# Patient Record
Sex: Male | Born: 1970 | Race: White | Hispanic: No | Marital: Married | Smoking: Former smoker
Health system: Southern US, Community
[De-identification: ages and names within clinical notes are randomized; demographics above are authoritative.]

## PROBLEM LIST (undated history)

## (undated) DIAGNOSIS — K649 Unspecified hemorrhoids: Secondary | ICD-10-CM

## (undated) DIAGNOSIS — A6000 Herpesviral infection of urogenital system, unspecified: Secondary | ICD-10-CM

## (undated) HISTORY — PX: ORIF PROXIMAL TIBIAL PLATEAU FRACTURE: SUR953

## (undated) HISTORY — PX: FRACTURE SURGERY: SHX138

## (undated) HISTORY — DX: Unspecified hemorrhoids: K64.9

## (undated) HISTORY — DX: Herpesviral infection of urogenital system, unspecified: A60.00

## (undated) HISTORY — PX: WISDOM TOOTH EXTRACTION: SHX21

---

## 2015-12-09 DIAGNOSIS — F4321 Adjustment disorder with depressed mood: Secondary | ICD-10-CM | POA: Diagnosis not present

## 2015-12-17 DIAGNOSIS — F4321 Adjustment disorder with depressed mood: Secondary | ICD-10-CM | POA: Diagnosis not present

## 2016-01-01 DIAGNOSIS — F4321 Adjustment disorder with depressed mood: Secondary | ICD-10-CM | POA: Diagnosis not present

## 2016-01-12 DIAGNOSIS — F4321 Adjustment disorder with depressed mood: Secondary | ICD-10-CM | POA: Diagnosis not present

## 2016-01-21 DIAGNOSIS — F4321 Adjustment disorder with depressed mood: Secondary | ICD-10-CM | POA: Diagnosis not present

## 2016-01-28 DIAGNOSIS — F4321 Adjustment disorder with depressed mood: Secondary | ICD-10-CM | POA: Diagnosis not present

## 2016-02-04 DIAGNOSIS — F4321 Adjustment disorder with depressed mood: Secondary | ICD-10-CM | POA: Diagnosis not present

## 2016-02-10 DIAGNOSIS — F4321 Adjustment disorder with depressed mood: Secondary | ICD-10-CM | POA: Diagnosis not present

## 2016-03-03 DIAGNOSIS — F4321 Adjustment disorder with depressed mood: Secondary | ICD-10-CM | POA: Diagnosis not present

## 2016-04-21 DIAGNOSIS — F4321 Adjustment disorder with depressed mood: Secondary | ICD-10-CM | POA: Diagnosis not present

## 2016-08-09 ENCOUNTER — Ambulatory Visit (INDEPENDENT_AMBULATORY_CARE_PROVIDER_SITE_OTHER): Payer: BLUE CROSS/BLUE SHIELD | Admitting: Family Medicine

## 2016-08-09 ENCOUNTER — Other Ambulatory Visit: Payer: Self-pay | Admitting: Family Medicine

## 2016-08-09 ENCOUNTER — Encounter: Payer: Self-pay | Admitting: Family Medicine

## 2016-08-09 VITALS — BP 127/80 | HR 74 | Temp 98.5°F | Resp 16 | Ht 73.0 in | Wt 225.8 lb

## 2016-08-09 DIAGNOSIS — A6002 Herpesviral infection of other male genital organs: Secondary | ICD-10-CM | POA: Diagnosis not present

## 2016-08-09 DIAGNOSIS — A6000 Herpesviral infection of urogenital system, unspecified: Secondary | ICD-10-CM | POA: Insufficient documentation

## 2016-08-09 DIAGNOSIS — Z0000 Encounter for general adult medical examination without abnormal findings: Secondary | ICD-10-CM

## 2016-08-09 DIAGNOSIS — M25521 Pain in right elbow: Secondary | ICD-10-CM | POA: Diagnosis not present

## 2016-08-09 DIAGNOSIS — Z7689 Persons encountering health services in other specified circumstances: Secondary | ICD-10-CM

## 2016-08-09 DIAGNOSIS — Z114 Encounter for screening for human immunodeficiency virus [HIV]: Secondary | ICD-10-CM

## 2016-08-09 DIAGNOSIS — Z131 Encounter for screening for diabetes mellitus: Secondary | ICD-10-CM

## 2016-08-09 DIAGNOSIS — D229 Melanocytic nevi, unspecified: Secondary | ICD-10-CM

## 2016-08-09 NOTE — Patient Instructions (Addendum)
Thank you for coming to the clinic today.  1. Referral to Dermatology - if you don't hear back within 2 weeks, call their office to check status  [place]   [place], [place] [address] Hours: 8AM-5PM Phone: [phone]  Sarina Ser, MD Brendolyn Patty, MD  --------------------------------- Vitals are good, No elevated blood pressure.  Try to work on improving regular exercise. Reduce Whole milk to 2% milk, and drink more water.  For the R elbow, likely a bursitis or swollen tissue around bursa of Elbow joint  Recommend to start taking Tylenol Extra Strength 500mg  tabs - take 1 to 2 tabs per dose (max 1000mg ) every 6-8 hours for pain (take regularly, don't skip a dose for next 7 days), max 24 hour daily dose is 6 tablets or 3000mg . In the future you can repeat the same everyday Tylenol course for 1-2 weeks at a time.  Try a muscle rub or topical medicine OTC.   If needed can take Ibuprofen/Advil/Naproxen as needed  Use RICE therapy: - R - Rest / relative rest with activity modification avoid overuse of joint - I - Ice packs (make sure you use a towel or sock / something to protect skin) - C - Compression with flexible Elbow Sleeve / ACE wrap to apply pressure and reduce swelling allowing more support - E - Elevation - if significant swelling, lift leg above heart level (toes above your nose) to help reduce swelling, most helpful at night after day of being on your feet   You will be due for FASTING BLOOD WORK (no food or drink after midnight before, only water or coffee without cream/sugar on the morning of)  - Please go ahead and schedule a "Lab Only" visit in the morning at the clinic for lab draw in 3 months  For Lab Results, once available within 2-3 days of blood draw, you can can log in to MyChart online to view your results and a brief explanation. Also, we can discuss results at next follow-up visit.  Please  schedule a Follow-up Appointment to: Return in about 3 months (around 11/09/2016) for Annual Physical.  If you have any other questions or concerns, please feel free to call the clinic or send a message through [facility]. You may also schedule an earlier appointment if necessary.  Kenneth Putnam, DO [facility]

## 2016-08-09 NOTE — Progress Notes (Signed)
Subjective:    Patient ID: Kenneth Young, male    DOB: 06/08/1970, 46 y.o.   MRN: [id number]  Kenneth Young is a 46 y.o. male presenting on 08/09/2016 for Establish Care (pt is concerned about moles needs referral for dermatology)  Previously from [place], he has been in [place] off and on for 8 years, and has been [place] resident for 4 years. Prior PCP in [place] several years ago.  HPI   Establish Care Today  Elevated BP in past remotely / No history of HTN - Reports no known concern with HTN in past. Has had occasional rare elevated BP. Does not check BP at home - Diet: Eats mostly balanced eats 50% at home and out. No significant concerns. Drinks mostly whole milk, coffee, some water daily. Uses sugar free flavoring for water. - Exercise: None currently active. 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 In past has used Famacyclovir PRN flare, this has run out. Not requesting refill today. No recent flare  Abnormal Mole R Toe / Generalized Moles on Back / History Excessive Sun Exposure - Reports concern today with abnormal raised pigmented mole on inner side of his Right great toe, has been there for >8 years, and previously dermatology was following him in [place], was told to "keep a watch on this one" and he is concerned it may have changed over the years. Asking second opinion. Also he endorses several moles on his back, has had other biopsied before unsure exact results. No known skin cancer or family history of skin cancer that he is aware of, but admits excessive sun exposure as child and sun burns.  R Elbow Pain / Fall: - Additional complaint with reported R elbow injury about 1-2 months ago, fall on stairs while going down, accidental tripped, and he had initial pain and swelling some bruising, then it  improved over few days to weeks with conservative therapy. No significant treatments done or evaluation. No X-ray. He felt maybe "a small spot was loose" in his elbow but could not tell seemed to resolve.  Past Medical History:  Diagnosis Date  . Genital herpes   . Hemorrhoid    Past Surgical History:  Procedure Laterality Date  . ORIF PROXIMAL TIBIAL PLATEAU FRACTURE    . WISDOM TOOTH EXTRACTION     Social History   Social History  . Marital status: Married    Spouse name: N/A  . Number of children: 1  . Years of education: College   Occupational History  . [employer], Has degree in Classical Greek/Latin   Social History Main Topics  . Smoking status: Former Smoker    Packs/day: 1.00    Years: 10.00    Types: Cigarettes, E-cigarettes    Quit date: 03/01/1992  . Smokeless tobacco: Former Systems developer     Comment: Former history smoked for 10 years, then quit  . Alcohol use 1.2 oz/week    2 Cans of beer per week     Comment: Limited to beer only  . Drug use: Yes    Types: Marijuana     Comment: 3x weekly, recreational / relaxation  . Sexual activity: Not on file   Other Topics Concern  . Not on file   Social History Narrative  . No narrative  on file   Family History  Problem Relation Age of Onset  . Cataracts Mother   . Osteoarthritis Mother        Bilateral Hip Replacement Surgery  . Hypertension Brother   . Prostate cancer Neg Hx   . Colon cancer Neg Hx    No current outpatient prescriptions on file prior to visit.   No current facility-administered medications on file prior to visit.     Review of Systems  Constitutional: Negative for activity change, appetite change, chills, diaphoresis, fatigue, fever and unexpected weight change.  HENT: Negative for congestion, hearing loss and sinus pressure.   Eyes: Negative for visual disturbance.  Respiratory: Negative for cough, chest tightness, shortness of breath and wheezing.     Cardiovascular: Negative for chest pain, palpitations and leg swelling.  Gastrointestinal: Negative for abdominal pain, anal bleeding, blood in stool, constipation, diarrhea, nausea and vomiting.  Endocrine: Negative for cold intolerance.  Genitourinary: Negative for decreased urine volume, difficulty urinating, dysuria, frequency, hematuria and testicular pain.  Musculoskeletal: Positive for arthralgias (Right elbow). Negative for back pain and neck pain.  Skin: Negative for rash.  Allergic/Immunologic: Negative for environmental allergies.  Neurological: Negative for dizziness, weakness, light-headedness, numbness and headaches.  Hematological: Negative for adenopathy.  Psychiatric/Behavioral: Negative for behavioral problems, dysphoric mood and sleep disturbance. The patient is not nervous/anxious.    Per HPI unless specifically indicated above     Objective:    BP 127/80   Pulse 74   Temp 98.5 F (36.9 C) (Oral)   Resp 16   Ht 6\' 1"  (1.854 m)   Wt 225 lb 12.8 oz (102.4 kg)   BMI 29.79 kg/m   Wt Readings from Last 3 Encounters:  08/09/16 225 lb 12.8 oz (102.4 kg)    Physical Exam  Constitutional: He is oriented to person, place, and time. He appears well-developed and well-nourished. No distress.  Well-appearing, comfortable, cooperative  HENT:  Head: Normocephalic and atraumatic.  Mouth/Throat: Oropharynx is clear and moist.  Eyes: Conjunctivae are normal. Right eye exhibits no discharge. Left eye exhibits no discharge.  Neck: Normal range of motion. Neck supple.  Cardiovascular: Normal rate.   Pulmonary/Chest: Effort normal.  Musculoskeletal: Normal range of motion. He exhibits no edema.  Elbow Inspection: slightly more full appearance R elbow, without obvious edema Palpation: mild tender deeper compression, otherwise mostly non-tender, benign. Slight palpation of thickened bursal sac with some minimal fluid ROM: full active ROM flex/ext Strength: 5/5  intact Neurovascular: intact  Neurological: He is alert and oriented to person, place, and time.  Skin: Skin is warm and dry. No rash noted. He is not diaphoretic. No erythema.  Multiple abnormal skin lesions  Lateral aspect of R great toe about < 1 cm hyperpigmented papular lesion, not entirely consistent with nevi, may be wart.  Back with extensive scattered benign appearing nevi, only few with slight asymmetry, and one spot non-pigmented lesion that has bothered him in past.  Psychiatric: He has a normal mood and affect. His behavior is normal.  Well groomed, good eye contact, normal speech and thoughts  Nursing note and vitals reviewed.    Right Foot / R Great toe   Back     No results found for this or any previous visit.    Assessment & Plan:   Problem List Items Addressed This Visit    Multiple atypical nevi - Primary    No acute changes today concerning for current malignancy or melanoma, but extensive amount of variable  nevi, did not perform complete body surface exam today. Patient prefers establish with local Dermatology, had been followed for years in [place]. - Referral to [facility] for evaluation of chronic skin lesion on R great toe and for routine skin CA screening body surface exam      Relevant Orders   Ambulatory referral to Dermatology   Genital herpes    Stable without complication or recent flare Secondary to exposure with current wife Off of medication PRN, had used Oral Acyclovir type med in past, declined refill       Other Visit Diagnoses    Encounter to establish care with new doctor       Right elbow pain      Most likely secondary to traumatic fall recently R elbow likely contusion with ecchymosis, and possibly some lingering bursitis with slight swelling and intermittent discomfort but overall gradual improvement unlikely to be fracture or other significant MSK injury - Reviewed conservative therapy today for Tylenol/NSAID PRN, Topical  relief, RICE therapy - Follow-up PRN - defer X-ray for now    History of Elevated BP - Normal BP today, monitor in office - Monitor outside office if able to    No orders of the defined types were placed in this encounter.     Follow up plan: Return in about 3 months (around 11/09/2016) for Annual Physical.  Future labs ordered.  Nobie Putnam, [facility] 08/09/2016, 1:22 PM

## 2016-08-09 NOTE — Assessment & Plan Note (Signed)
No acute changes today concerning for current malignancy or melanoma, but extensive amount of variable nevi, did not perform complete body surface exam today. Patient prefers establish with local Dermatology, had been followed for years in [place]. - Referral to [facility] for evaluation of chronic skin lesion on R great toe and for routine skin CA screening body surface exam

## 2016-08-09 NOTE — Assessment & Plan Note (Signed)
Stable without complication or recent flare Secondary to exposure with current wife Off of medication PRN, had used Oral Acyclovir type med in past, declined refill

## 2016-11-24 DIAGNOSIS — L820 Inflamed seborrheic keratosis: Secondary | ICD-10-CM | POA: Diagnosis not present

## 2016-11-24 DIAGNOSIS — D2271 Melanocytic nevi of right lower limb, including hip: Secondary | ICD-10-CM | POA: Diagnosis not present

## 2016-11-24 DIAGNOSIS — R21 Rash and other nonspecific skin eruption: Secondary | ICD-10-CM | POA: Diagnosis not present

## 2016-11-24 DIAGNOSIS — D225 Melanocytic nevi of trunk: Secondary | ICD-10-CM | POA: Diagnosis not present

## 2016-11-24 DIAGNOSIS — L578 Other skin changes due to chronic exposure to nonionizing radiation: Secondary | ICD-10-CM | POA: Diagnosis not present

## 2016-11-24 DIAGNOSIS — D485 Neoplasm of uncertain behavior of skin: Secondary | ICD-10-CM | POA: Diagnosis not present

## 2016-11-24 DIAGNOSIS — D1800 Hemangioma unspecified site: Secondary | ICD-10-CM | POA: Diagnosis not present

## 2016-11-24 DIAGNOSIS — D229 Melanocytic nevi, unspecified: Secondary | ICD-10-CM | POA: Diagnosis not present

## 2017-03-10 DIAGNOSIS — D2371 Other benign neoplasm of skin of right lower limb, including hip: Secondary | ICD-10-CM | POA: Diagnosis not present

## 2017-04-11 DIAGNOSIS — D229 Melanocytic nevi, unspecified: Secondary | ICD-10-CM | POA: Diagnosis not present

## 2017-04-11 DIAGNOSIS — D2271 Melanocytic nevi of right lower limb, including hip: Secondary | ICD-10-CM | POA: Diagnosis not present

## 2017-04-12 ENCOUNTER — Ambulatory Visit: Payer: Self-pay

## 2017-04-12 ENCOUNTER — Encounter: Payer: Self-pay | Admitting: Podiatry

## 2017-04-12 ENCOUNTER — Ambulatory Visit (INDEPENDENT_AMBULATORY_CARE_PROVIDER_SITE_OTHER): Payer: BLUE CROSS/BLUE SHIELD

## 2017-04-12 ENCOUNTER — Ambulatory Visit (INDEPENDENT_AMBULATORY_CARE_PROVIDER_SITE_OTHER): Payer: BLUE CROSS/BLUE SHIELD | Admitting: Podiatry

## 2017-04-12 VITALS — BP 142/88 | HR 63 | Resp 16

## 2017-04-12 DIAGNOSIS — M722 Plantar fascial fibromatosis: Secondary | ICD-10-CM | POA: Diagnosis not present

## 2017-04-12 MED ORDER — DICLOFENAC SODIUM 75 MG PO TBEC: 75.0000 mg | DELAYED_RELEASE_TABLET | Freq: Two times a day (BID) | ORAL | 1 refills | Status: DC

## 2017-04-12 NOTE — Progress Notes (Signed)
   Subjective:    Patient ID: Kenneth Young, male    DOB: 12-31-1970, 47 y.o.   MRN: [id number]  HPI Chief Complaint  Patient presents with  . Foot Pain    Plantar heel left - aching x 1 month, onset was sudden, no injury, AM pain, tried advil-helps some      Review of Systems  All other systems reviewed and are negative.      Objective:   Physical Exam        Assessment & Plan:

## 2017-04-12 NOTE — Patient Instructions (Signed)

## 2017-04-13 NOTE — Progress Notes (Signed)
   Subjective: 47 year old male presents today with a chief complaint of aching pain and tenderness in the plantar aspect of the left heel that began suddenly one month ago. He states that it hurts in the mornings with the first steps out of bed. He has been taking Advil with some relief of the pain. Patient presents today for further treatment and evaluation.  Past Medical History:  Diagnosis Date  . Genital herpes   . Hemorrhoid     Objective: Physical Exam General: The patient is alert and oriented x3 in no acute distress.  Dermatology: Skin is warm, dry and supple bilateral lower extremities. Negative for open lesions or macerations bilateral.   Vascular: Dorsalis Pedis and Posterior Tibial pulses palpable bilateral.  Capillary fill time is immediate to all digits.  Neurological: Epicritic and protective threshold intact bilateral.   Musculoskeletal: Tenderness to palpation at the medial calcaneal tubercale and through the insertion of the plantar fascia of the left foot. All other joints range of motion within normal limits bilateral. Strength 5/5 in all groups bilateral.   Radiographic exam:   Normal osseous mineralization. Joint spaces preserved. No fracture/dislocation/boney destruction. Calcaneal spur present with mild thickening of plantar fascia left. No other soft tissue abnormalities or radiopaque foreign bodies.   Assessment: 1. Plantar fasciitis left foot  Plan of Care:  1. Patient evaluated. Xrays reviewed.   2. Injection of 0.5cc Celestone soluspan injected into the left plantar fascia.  3. Rx for Diclofenac 75mg  PO BID ordered for patient. 4. Plantar fascial band(s) dispensed  5. Instructed patient regarding therapies and modalities at home to alleviate symptoms.  6. Return to clinic in 4 weeks.    Works in Engineer, technical sales, [employer]   Edrick Kins, DPM [facility]  Dr. Edrick Kins, DPM    [address], [place] [address]                Office [phone]  Fax [phone]

## 2017-05-10 ENCOUNTER — Ambulatory Visit: Payer: BLUE CROSS/BLUE SHIELD | Admitting: Podiatry

## 2017-10-10 DIAGNOSIS — L720 Epidermal cyst: Secondary | ICD-10-CM | POA: Diagnosis not present

## 2017-10-10 DIAGNOSIS — L820 Inflamed seborrheic keratosis: Secondary | ICD-10-CM | POA: Diagnosis not present

## 2017-10-10 DIAGNOSIS — L739 Follicular disorder, unspecified: Secondary | ICD-10-CM | POA: Diagnosis not present

## 2017-10-10 DIAGNOSIS — D485 Neoplasm of uncertain behavior of skin: Secondary | ICD-10-CM | POA: Diagnosis not present

## 2019-02-16 ENCOUNTER — Telehealth: Payer: Self-pay | Admitting: Family Medicine

## 2019-02-16 DIAGNOSIS — Z0000 Encounter for general adult medical examination without abnormal findings: Secondary | ICD-10-CM

## 2019-02-19 NOTE — Telephone Encounter (Signed)
Labs ordered.  Nobie Putnam, DO [facility] 02/19/2019, 4:37 PM

## 2019-03-05 ENCOUNTER — Other Ambulatory Visit: Payer: Self-pay

## 2019-03-05 DIAGNOSIS — Z0000 Encounter for general adult medical examination without abnormal findings: Secondary | ICD-10-CM

## 2019-03-06 LAB — CBC WITH DIFFERENTIAL/PLATELET
Absolute Monocytes: 753 cells/uL (ref 200–950)
Basophils Absolute: 57 cells/uL (ref 0–200)
Basophils Relative: 0.8 %
Eosinophils Absolute: 192 cells/uL (ref 15–500)
Eosinophils Relative: 2.7 %
HCT: 43.6 % (ref 38.5–50.0)
Hemoglobin: 14.7 g/dL (ref 13.2–17.1)
Lymphs Abs: 1917 cells/uL (ref 850–3900)
MCH: 29 pg (ref 27.0–33.0)
MCHC: 33.7 g/dL (ref 32.0–36.0)
MCV: 86 fL (ref 80.0–100.0)
MPV: 11.9 fL (ref 7.5–12.5)
Monocytes Relative: 10.6 %
Neutro Abs: 4182 cells/uL (ref 1500–7800)
Neutrophils Relative %: 58.9 %
Platelets: 150 10*3/uL (ref 140–400)
RBC: 5.07 10*6/uL (ref 4.20–5.80)
RDW: 13 % (ref 11.0–15.0)
Total Lymphocyte: 27 %
WBC: 7.1 10*3/uL (ref 3.8–10.8)

## 2019-03-06 LAB — COMPLETE METABOLIC PANEL WITH GFR
AG Ratio: 1.6 (calc) (ref 1.0–2.5)
ALT: 51 U/L — ABNORMAL HIGH (ref 9–46)
AST: 29 U/L (ref 10–40)
Albumin: 4.6 g/dL (ref 3.6–5.1)
Alkaline phosphatase (APISO): 60 U/L (ref 36–130)
BUN: 14 mg/dL (ref 7–25)
CO2: 22 mmol/L (ref 20–32)
Calcium: 9.4 mg/dL (ref 8.6–10.3)
Chloride: 107 mmol/L (ref 98–110)
Creat: 1.04 mg/dL (ref 0.60–1.35)
GFR, Est African American: 97 mL/min/{1.73_m2} (ref >=60)
GFR, Est Non African American: 84 mL/min/{1.73_m2} (ref >=60)
Globulin: 2.8 g/dL (calc) (ref 1.9–3.7)
Glucose, Bld: 106 mg/dL — ABNORMAL HIGH (ref 65–99)
Potassium: 4.2 mmol/L (ref 3.5–5.3)
Sodium: 138 mmol/L (ref 135–146)
Total Bilirubin: 0.4 mg/dL (ref 0.2–1.2)
Total Protein: 7.4 g/dL (ref 6.1–8.1)

## 2019-03-06 LAB — LIPID PANEL
Cholesterol: 197 mg/dL (ref <200)
HDL: 32 mg/dL — ABNORMAL LOW (ref >=40)
LDL Cholesterol (Calc): 112 mg/dL (calc) — ABNORMAL HIGH
Non-HDL Cholesterol (Calc): 165 mg/dL (calc) — ABNORMAL HIGH (ref <130)
Total CHOL/HDL Ratio: 6.2 (calc) — ABNORMAL HIGH (ref <5.0)
Triglycerides: 369 mg/dL — ABNORMAL HIGH (ref <150)

## 2019-03-06 LAB — HEMOGLOBIN A1C
Hgb A1c MFr Bld: 5.7 % of total Hgb — ABNORMAL HIGH (ref <5.7)
Mean Plasma Glucose: 117 (calc)
eAG (mmol/L): 6.5 (calc)

## 2019-03-08 ENCOUNTER — Encounter: Payer: Self-pay | Admitting: Family Medicine

## 2019-03-08 ENCOUNTER — Other Ambulatory Visit: Payer: Self-pay

## 2019-03-08 ENCOUNTER — Other Ambulatory Visit: Payer: Self-pay | Admitting: Family Medicine

## 2019-03-08 ENCOUNTER — Ambulatory Visit (INDEPENDENT_AMBULATORY_CARE_PROVIDER_SITE_OTHER): Payer: BC Managed Care – PPO | Admitting: Family Medicine

## 2019-03-08 VITALS — BP 123/72 | HR 67 | Temp 98.0°F | Resp 16 | Ht 73.0 in | Wt 236.0 lb

## 2019-03-08 DIAGNOSIS — Z0000 Encounter for general adult medical examination without abnormal findings: Secondary | ICD-10-CM

## 2019-03-08 DIAGNOSIS — R7309 Other abnormal glucose: Secondary | ICD-10-CM

## 2019-03-08 DIAGNOSIS — K644 Residual hemorrhoidal skin tags: Secondary | ICD-10-CM | POA: Diagnosis not present

## 2019-03-08 DIAGNOSIS — Z125 Encounter for screening for malignant neoplasm of prostate: Secondary | ICD-10-CM

## 2019-03-08 DIAGNOSIS — Z23 Encounter for immunization: Secondary | ICD-10-CM

## 2019-03-08 DIAGNOSIS — E782 Mixed hyperlipidemia: Secondary | ICD-10-CM

## 2019-03-08 DIAGNOSIS — Z1211 Encounter for screening for malignant neoplasm of colon: Secondary | ICD-10-CM

## 2019-03-08 DIAGNOSIS — E669 Obesity, unspecified: Secondary | ICD-10-CM

## 2019-03-08 NOTE — Progress Notes (Signed)
Subjective:    Patient ID: Kenneth Young, male    DOB: [birth year], 49 y.o.   MRN: [id number]  Kenneth Young is a 49 y.o. male presenting on 03/08/2019 for Annual Exam   HPI   Here for Annual Physical and Lab Review.  HYPERLIPIDEMIA: - Last lipid panel 03/2019, low HDL and elevated LDL 112 and elevated TG >300 Not taking any cholesterol med  Pre-Diabetes / Obesity BMI >31 Reports no concerns previously, elevated A1c 5.7 on last result. Meds: never on med Weight gain in 2 years Lifestyle: - Diet (not adhering any particular diet, goal to improve)  Denies hypoglycemia, polyuria, visual changes, numbness or tingling.  External Hemorrhoids Admits some rectal bleeding, bright red blood per, occasional episode, some pain but no other complication. Has not been diagnosed or treated. He improves diet to help his bowel movements.  Taking Vitamin D immune benefit.   Health Maintenance:  Due for Flu Shot, will receive today    Prostate CA Screening: No prior prostate CA screening. Currently asymptomatic. No known family history of prostate CA. Due for screening.  Colon CA Screening: Never had colonoscopy. Currently asymptomatic except external hemorrhoid see above. No known family history of colon CA. Due for screening test considering Cologuard, counseling given   Depression screen [facility] 2/9 03/08/2019 08/09/2016  Decreased Interest 0 0  Down, Depressed, Hopeless 0 0  PHQ - 2 Score 0 0  Altered sleeping 0 -  Tired, decreased energy 0 -  Change in appetite 0 -  Feeling bad or failure about yourself  0 -  Trouble concentrating 0 -  Moving slowly or fidgety/restless 0 -  Suicidal thoughts 0 -  PHQ-9 Score 0 -  Difficult doing work/chores Not difficult at all -    Past Medical History:  Diagnosis Date  . Genital herpes   . Hemorrhoid    Past Surgical History:  Procedure Laterality Date  . ORIF PROXIMAL TIBIAL PLATEAU FRACTURE    . WISDOM TOOTH EXTRACTION     Social History    Socioeconomic History  . Marital status: Married    Spouse name: Not on file  . Number of children: 1  . Years of education: College  . Highest education level: Not on file  Occupational History  . Occupation: Aeronautical engineer    Comment: Computers, Has degree in Classical Greek/Latin  Tobacco Use  . Smoking status: Former Smoker    Packs/day: 1.00    Years: 10.00    Pack years: 10.00    Quit date: 03/01/1992    Years since quitting: 27.0  . Smokeless tobacco: Former Systems developer  . Tobacco comment: Former history smoked for 10 years, then quit  Substance and Sexual Activity  . Alcohol use: Yes    Alcohol/week: 2.0 standard drinks    Types: 2 Cans of beer per week    Comment: Limited to beer only  . Drug use: Yes    Types: Marijuana    Comment: 3x weekly, recreational / relaxation  . Sexual activity: Not on file  Other Topics Concern  . Not on file  Social History Narrative  . Not on file   Social Determinants of Health   Financial Resource Strain:   . Difficulty of Paying Living Expenses: Not on file  Food Insecurity:   . Worried About Charity fundraiser in the Last Year: Not on file  . Ran Out of Food in the Last Year: Not on file  Transportation Needs:   . Lack  of Transportation (Medical): Not on file  . Lack of Transportation (Non-Medical): Not on file  Physical Activity:   . Days of Exercise per Week: Not on file  . Minutes of Exercise per Session: Not on file  Stress:   . Feeling of Stress : Not on file  Social Connections:   . Frequency of Communication with Friends and Family: Not on file  . Frequency of Social Gatherings with Friends and Family: Not on file  . Attends Religious Services: Not on file  . Active Member of Clubs or Organizations: Not on file  . Attends Archivist Meetings: Not on file  . Marital Status: Not on file  Intimate Partner Violence:   . Fear of Current or Ex-Partner: Not on file  . Emotionally Abused: Not on file  .  Physically Abused: Not on file  . Sexually Abused: Not on file   Family History  Problem Relation Age of Onset  . Cataracts Mother   . Osteoarthritis Mother        Bilateral Hip Replacement Surgery  . Hypertension Brother   . Prostate cancer Neg Hx   . Colon cancer Neg Hx    Current Outpatient Medications on File Prior to Visit  Medication Sig  . diclofenac (VOLTAREN) 75 MG EC tablet Take 1 tablet (75 mg total) by mouth 2 (two) times daily. (Patient not taking: Reported on 03/08/2019)   No current facility-administered medications on file prior to visit.    Review of Systems  Constitutional: Negative for activity change, appetite change, chills, diaphoresis, fatigue and fever.  HENT: Negative for congestion and hearing loss.   Eyes: Negative for visual disturbance.  Respiratory: Negative for apnea, cough, chest tightness, shortness of breath and wheezing.   Cardiovascular: Negative for chest pain, palpitations and leg swelling.  Gastrointestinal: Positive for anal bleeding (hemorrhoid). Negative for abdominal pain, blood in stool, constipation, diarrhea, nausea and vomiting.  Endocrine: Negative for cold intolerance.  Genitourinary: Negative for decreased urine volume, difficulty urinating, dysuria, frequency, hematuria, testicular pain and urgency.  Musculoskeletal: Negative for arthralgias, back pain and neck pain.  Skin: Negative for rash.  Allergic/Immunologic: Negative for environmental allergies.  Neurological: Negative for dizziness, weakness, light-headedness, numbness and headaches.  Hematological: Negative for adenopathy.  Psychiatric/Behavioral: Negative for behavioral problems, dysphoric mood and sleep disturbance. The patient is not nervous/anxious.    Per HPI unless specifically indicated above      Objective:    BP 123/72   Pulse 67   Temp 98 F (36.7 C) (Oral)   Resp 16   Ht 6\' 1"  (1.854 m)   Wt 236 lb (107 kg)   BMI 31.14 kg/m   Wt Readings from Last 3  Encounters:  03/08/19 236 lb (107 kg)  08/09/16 225 lb 12.8 oz (102.4 kg)    Physical Exam Vitals and nursing note reviewed.  Constitutional:      General: He is not in acute distress.    Appearance: He is well-developed. He is not diaphoretic.     Comments: Well-appearing, comfortable, cooperative  HENT:     Head: Normocephalic and atraumatic.     Right Ear: Tympanic membrane, ear canal and external ear normal.     Left Ear: Tympanic membrane, ear canal and external ear normal.  Eyes:     General:        Right eye: No discharge.        Left eye: No discharge.     Conjunctiva/sclera: Conjunctivae normal.  Pupils: Pupils are equal, round, and reactive to light.  Neck:     Thyroid: No thyromegaly.     Comments: No carotid bruit Cardiovascular:     Rate and Rhythm: Normal rate and regular rhythm.     Heart sounds: Normal heart sounds. No murmur.  Pulmonary:     Effort: Pulmonary effort is normal. No respiratory distress.     Breath sounds: Normal breath sounds. No wheezing or rales.  Abdominal:     General: Bowel sounds are normal. There is no distension.     Palpations: Abdomen is soft. There is no mass.     Tenderness: There is no abdominal tenderness.  Genitourinary:    Comments: Rectal External rectal exam with moderate sized soft non inflammed external hemorrhoid R side approx 5 o clock. No fissure. Did not perform invasive DRE exam today. Musculoskeletal:        General: No tenderness. Normal range of motion.     Cervical back: Normal range of motion and neck supple.     Comments: Upper / Lower Extremities: - Normal muscle tone, strength bilateral upper extremities 5/5, lower extremities 5/5  Lymphadenopathy:     Cervical: No cervical adenopathy.  Skin:    General: Skin is warm and dry.     Findings: No erythema or rash.  Neurological:     Mental Status: He is alert and oriented to person, place, and time.     Comments: Distal sensation intact to light touch all  extremities  Psychiatric:        Behavior: Behavior normal.     Comments: Well groomed, good eye contact, normal speech and thoughts       Results for orders placed or performed in visit on 03/05/19  physical Lipid panel  Result Value Ref Range   Cholesterol 197 <200 mg/dL   HDL 32 (L) > OR = 40 mg/dL   Triglycerides 369 (H) <150 mg/dL   LDL Cholesterol (Calc) 112 (H) mg/dL (calc)   Total CHOL/HDL Ratio 6.2 (H) <5.0 (calc)   Non-HDL Cholesterol (Calc) 165 (H) <130 mg/dL (calc)  cmet with GFR physical  Result Value Ref Range   Glucose, Bld 106 (H) 65 - 99 mg/dL   BUN 14 7 - 25 mg/dL   Creat 1.04 0.60 - 1.35 mg/dL   GFR, Est Non African American 84 > OR = 60 mL/min/1.67m2   GFR, Est African American 97 > OR = 60 mL/min/1.4m2   BUN/Creatinine Ratio NOT APPLICABLE 6 - 22 (calc)   Sodium 138 135 - 146 mmol/L   Potassium 4.2 3.5 - 5.3 mmol/L   Chloride 107 98 - 110 mmol/L   CO2 22 20 - 32 mmol/L   Calcium 9.4 8.6 - 10.3 mg/dL   Total Protein 7.4 6.1 - 8.1 g/dL   Albumin 4.6 3.6 - 5.1 g/dL   Globulin 2.8 1.9 - 3.7 g/dL (calc)   AG Ratio 1.6 1.0 - 2.5 (calc)   Total Bilirubin 0.4 0.2 - 1.2 mg/dL   Alkaline phosphatase (APISO) 60 36 - 130 U/L   AST 29 10 - 40 U/L   ALT 51 (H) 9 - 46 U/L  physical HgB A1c  Result Value Ref Range   Hgb A1c MFr Bld 5.7 (H) <5.7 % of total Hgb   Mean Plasma Glucose 117 (calc)   eAG (mmol/L) 6.5 (calc)  physical CBC  Result Value Ref Range   WBC 7.1 3.8 - 10.8 Thousand/uL   RBC 5.07 4.20 - 5.80  Million/uL   Hemoglobin 14.7 13.2 - 17.1 g/dL   HCT 43.6 38.5 - 50.0 %   MCV 86.0 80.0 - 100.0 fL   MCH 29.0 27.0 - 33.0 pg   MCHC 33.7 32.0 - 36.0 g/dL   RDW 13.0 11.0 - 15.0 %   Platelets 150 140 - 400 Thousand/uL   MPV 11.9 7.5 - 12.5 fL   Neutro Abs 4,182 1,500 - 7,800 cells/uL   Lymphs Abs 1,917 850 - 3,900 cells/uL   Absolute Monocytes 753 200 - 950 cells/uL   Eosinophils Absolute 192 15 - 500 cells/uL   Basophils Absolute 57 0 - 200  cells/uL   Neutrophils Relative % 58.9 %   Total Lymphocyte 27.0 %   Monocytes Relative 10.6 %   Eosinophils Relative 2.7 %   Basophils Relative 0.8 %      Assessment & Plan:   Problem List Items Addressed This Visit    Mixed hyperlipidemia   External hemorrhoid   Elevated hemoglobin A1c    Other Visit Diagnoses    Annual physical exam    -  Primary   Needs flu shot       Relevant Orders   [facility] - Flu Vaccine QUAD 36+ mos PF IM (Fluarix & Fluzone Quad PF) (Completed)   Obesity (BMI 30.0-34.9)       Screening for colon cancer       Relevant Orders   [place] - Cologuard      Updated Health Maintenance information Reviewed recent lab results with patient Encouraged improvement to lifestyle with diet and exercise  Goal of weight loss  #Elevated lipids, dyslipidemia Discussed lifestyle Low risk ASCVD No new medication or treatment  #Elevated A1c 5.7, new problem Encourage lifestyle diet exercise, handout given low carb glycemic diet No medication at this time Repeat 6 months  #External hemorrhoid Uncomplicated Reviewed course with hemorrhoids and some treatment options Surveillance for now, diet improve  Due for routine colon cancer screening. Never had colonoscopy (not interested), no family history colon cancer. - Discussion today about recommendations for either Colonoscopy or Cologuard screening, benefits and risks of screening, interested in Cologuard, understands that if positive then recommendation is for diagnostic colonoscopy to follow-up. - Ordered Cologuard today  - Patient advised to contact insurance first to learn cost,    No orders of the defined types were placed in this encounter.   Follow up plan: Return in about 6 months (around 09/05/2019) for 6 month Elevated A1c / Lab test.   Future labs 6 months A1c and PSA age [place], [facility] 03/08/2019, 10:14 AM

## 2019-03-08 NOTE — Patient Instructions (Addendum)
Thank you for coming to the office today.  Recent Labs    03/05/19 0859  HGBA1C 5.7*   Goal to limit starch and sugar / carbs - to improve blood sugar Keep improving diet and exercise, weight loss  External hemorrhoid, uncomplicated - no sign of other problem with it at the moment.  Colon Cancer Screening: - For all adults age 50+ routine colon cancer screening is highly recommended.     - Recent guidelines from [place] recommend starting age of 19 - Early detection of colon cancer is important, because often there are no warning signs or symptoms, also if found early usually it can be cured. Late stage is hard to treat.  - If you are not interested in Colonoscopy screening (if done and normal you could be cleared for 5 to 10 years until next due), then Cologuard is an excellent alternative for screening test for Colon Cancer. It is highly sensitive for detecting DNA of colon cancer from even the earliest stages. Also, there is NO bowel prep required. - If Cologuard is NEGATIVE, then it is good for 3 years before next due - If Cologuard is POSITIVE, then it is strongly advised to get a Colonoscopy, which allows the GI doctor to locate the source of the cancer or polyp (even very early stage) and treat it by removing it. ------------------------- If you would like to proceed with Cologuard (stool DNA test) - FIRST, call your insurance company and tell them you want to check cost of Cologuard tell them CPT Code [phone] (it may be completely covered and you could get for no cost, OR max cost without any coverage is about $600). Also, keep in mind if you do NOT open the kit, and decide not to do the test, you will NOT be charged, you should contact the company if you decide not to do the test.  Follow instructions to collect sample, you may call the company for any help or questions, 24/7 telephone support at [phone].  DUE for NON fasting blood work(no food or drink after  midnight before the lab appointment, only water or coffee without cream/sugar on the morning of)  SCHEDULE "Lab Only" visit in the morning at the clinic for lab draw in 6 MONTHS   - Make sure Lab Only appointment is at about 1 week before your next appointment, so that results will be available  For Lab Results, once available within 2-3 days of blood draw, you can can log in to MyChart online to view your results and a brief explanation. Also, we can discuss results at next follow-up visit.    Please schedule a Follow-up Appointment to: Return in about 6 months (around 09/05/2019) for 6 month Elevated A1c / Lab test.  If you have any other questions or concerns, please feel free to call the office or send a message through [place]. You may also schedule an earlier appointment if necessary.  Additionally, you may be receiving a survey about your experience at our office within a few days to 1 week by e-mail or mail. We value your feedback.  Nobie Putnam, DO [place]

## 2019-06-22 ENCOUNTER — Other Ambulatory Visit: Payer: Self-pay

## 2019-06-22 ENCOUNTER — Ambulatory Visit: Payer: BC Managed Care – PPO | Admitting: Family Medicine

## 2019-06-22 ENCOUNTER — Encounter: Payer: Self-pay | Admitting: Family Medicine

## 2019-06-22 VITALS — BP 121/71 | HR 66 | Temp 96.6°F | Resp 16 | Ht 73.0 in | Wt 223.6 lb

## 2019-06-22 DIAGNOSIS — M71331 Other bursal cyst, right wrist: Secondary | ICD-10-CM | POA: Diagnosis not present

## 2019-06-22 NOTE — Progress Notes (Signed)
Subjective:    Patient ID: Kenneth Young, male    DOB: 01/08/1971, 49 y.o.   MRN: [id number]  Kenneth Young is a 49 y.o. male presenting on 06/22/2019 for Cyst (Right side wrist pain)   HPI    Right Cyst, dorsal wrist Chronic problem for years, has had a soft "bump" on the top backside of his R wrist, we evaluated it back in 2018 in past, but did not cause issues, he has not had any treatment. - Now recent issue in past 1 week he has had some increase size or swelling in this spot. He said it has become sore and irritated, he did manipulate it more recently. He does do a lot of typing and repetitive activities with his hands/wrists. - Denies any drainage of fluid or pus, spreading redness, numbness tingling, other cyst or other lesions  Additional history - reviewed again R elbow - reported feeling a "small loose spot", see prior history 2018. Prior accidental injury back in 2018, no x-ray done, it doesn't cause pain or bother him but he can feel it at times.  Health Maintenance:  Previously ordered Cologuard - but patient has declined to do this test, he prefers to proceed with Colonoscopy screening now once age 41+ after 08/04/19. He is asymptomatic. No fam history of colon cancer.  Depression screen [facility] 2/9 06/22/2019 03/08/2019 08/09/2016  Decreased Interest 0 0 0  Down, Depressed, Hopeless 0 0 0  PHQ - 2 Score 0 0 0  Altered sleeping - 0 -  Tired, decreased energy - 0 -  Change in appetite - 0 -  Feeling bad or failure about yourself  - 0 -  Trouble concentrating - 0 -  Moving slowly or fidgety/restless - 0 -  Suicidal thoughts - 0 -  PHQ-9 Score - 0 -  Difficult doing work/chores - Not difficult at all -    Social History   Tobacco Use  . Smoking status: Former Smoker    Packs/day: 1.00    Years: 10.00    Pack years: 10.00    Quit date: 03/01/1992    Years since quitting: 27.3  . Smokeless tobacco: Former Systems developer  . Tobacco comment: Former history smoked for 10 years, then  quit  Substance Use Topics  . Alcohol use: Yes    Alcohol/week: 2.0 standard drinks    Types: 2 Cans of beer per week    Comment: Limited to beer only  . Drug use: Yes    Types: Marijuana    Comment: 3x weekly, recreational / relaxation   Family History  Problem Relation Age of Onset  . Cataracts Mother   . Osteoarthritis Mother        Bilateral Hip Replacement Surgery  . Hypertension Brother   . Prostate cancer Neg Hx   . Colon cancer Neg Hx      Review of Systems Per HPI unless specifically indicated above     Objective:    BP 121/71   Pulse 66   Temp (!) 96.6 F (35.9 C) (Temporal)   Resp 16   Ht 6\' 1"  (1.854 m)   Wt 223 lb 9.6 oz (101.4 kg)   BMI 29.50 kg/m   Wt Readings from Last 3 Encounters:  06/22/19 223 lb 9.6 oz (101.4 kg)  03/08/19 236 lb (107 kg)  08/09/16 225 lb 12.8 oz (102.4 kg)    Physical Exam Vitals and nursing note reviewed.  Constitutional:      General: He is not  in acute distress.    Appearance: He is well-developed. He is not diaphoretic.     Comments: Well-appearing, comfortable, cooperative  HENT:     Head: Normocephalic and atraumatic.  Eyes:     General:        Right eye: No discharge.        Left eye: No discharge.     Conjunctiva/sclera: Conjunctivae normal.  Cardiovascular:     Rate and Rhythm: Normal rate.  Pulmonary:     Effort: Pulmonary effort is normal.  Musculoskeletal:     Comments: Right dorsal wrist, near ulnar styloid with raised 1.5 cm approx soft but somewhat firm mobile cyst like structure. Non tender, some localized superficial redness not considered extending erythema. Mild sore on palpation. Sensation intact.  Skin:    General: Skin is warm and dry.     Findings: No erythema or rash.  Neurological:     Mental Status: He is alert and oriented to person, place, and time.  Psychiatric:        Behavior: Behavior normal.     Comments: Well groomed, good eye contact, normal speech and thoughts      Results  for orders placed or performed in visit on 03/05/19  physical Lipid panel  Result Value Ref Range   Cholesterol 197 <200 mg/dL   HDL 32 (L) > OR = 40 mg/dL   Triglycerides 369 (H) <150 mg/dL   LDL Cholesterol (Calc) 112 (H) mg/dL (calc)   Total CHOL/HDL Ratio 6.2 (H) <5.0 (calc)   Non-HDL Cholesterol (Calc) 165 (H) <130 mg/dL (calc)  cmet with GFR physical  Result Value Ref Range   Glucose, Bld 106 (H) 65 - 99 mg/dL   BUN 14 7 - 25 mg/dL   Creat 1.04 0.60 - 1.35 mg/dL   GFR, Est Non African American 84 > OR = 60 mL/min/1.51m2   GFR, Est African American 97 > OR = 60 mL/min/1.94m2   BUN/Creatinine Ratio NOT APPLICABLE 6 - 22 (calc)   Sodium 138 135 - 146 mmol/L   Potassium 4.2 3.5 - 5.3 mmol/L   Chloride 107 98 - 110 mmol/L   CO2 22 20 - 32 mmol/L   Calcium 9.4 8.6 - 10.3 mg/dL   Total Protein 7.4 6.1 - 8.1 g/dL   Albumin 4.6 3.6 - 5.1 g/dL   Globulin 2.8 1.9 - 3.7 g/dL (calc)   AG Ratio 1.6 1.0 - 2.5 (calc)   Total Bilirubin 0.4 0.2 - 1.2 mg/dL   Alkaline phosphatase (APISO) 60 36 - 130 U/L   AST 29 10 - 40 U/L   ALT 51 (H) 9 - 46 U/L  physical HgB A1c  Result Value Ref Range   Hgb A1c MFr Bld 5.7 (H) <5.7 % of total Hgb   Mean Plasma Glucose 117 (calc)   eAG (mmol/L) 6.5 (calc)  physical CBC  Result Value Ref Range   WBC 7.1 3.8 - 10.8 Thousand/uL   RBC 5.07 4.20 - 5.80 Million/uL   Hemoglobin 14.7 13.2 - 17.1 g/dL   HCT 43.6 38.5 - 50.0 %   MCV 86.0 80.0 - 100.0 fL   MCH 29.0 27.0 - 33.0 pg   MCHC 33.7 32.0 - 36.0 g/dL   RDW 13.0 11.0 - 15.0 %   Platelets 150 140 - 400 Thousand/uL   MPV 11.9 7.5 - 12.5 fL   Neutro Abs 4,182 1,500 - 7,800 cells/uL   Lymphs Abs 1,917 850 - 3,900 cells/uL   Absolute Monocytes 753  200 - 950 cells/uL   Eosinophils Absolute 192 15 - 500 cells/uL   Basophils Absolute 57 0 - 200 cells/uL   Neutrophils Relative % 58.9 %   Total Lymphocyte 27.0 %   Monocytes Relative 10.6 %   Eosinophils Relative 2.7 %   Basophils Relative 0.8 %        Assessment & Plan:   Problem List Items Addressed This Visit    None    Visit Diagnoses    Synovial cyst of right wrist    -  Primary      Chronic problem present for years, localized R dorsal wrist cyst of joint it appears, less likely ganglion cyst based on location and symptoms. Some inc in size, slight inflammation from manipulation in past 1 week. - No other significant symptoms  Plan Patient considering surgical treatment / removal and options after our discussion today. However, based on long history and overall given him reassurance, most likely cyst vs lipoma or benign etiology, we agree to monitor and observe for now, allow it to calm down and see if reduces in size or if it worsens and does not improve.  I called several surgical offices including local [place] / [place] - ultimately as advised he would need a Hand/Wrist Surgical Specialist - called [facility] - and spoke with their clinical triage, they would take the referral and be able to treat this cyst if referral, some of their surgeons would be scheduling out until June 2021 at this point  Called the patient back, he would like to observe and monitor it for now. He can message or call if he decides to proceed with surgical referral, otherwise f/u as scheduled in 08/2019 with me.  No orders of the defined types were placed in this encounter.    Follow up plan: Return in about 2 months (around 09/05/2019) for keep scheduled apt, wrist / colonoscopy.  Future order referral to Colonoscopy after age 81+  Nobie Putnam, [facility] 06/22/2019, 9:45 AM

## 2019-06-22 NOTE — Patient Instructions (Addendum)
Thank you for coming to the office today.  We can wait for 09/05/19 apt to order these, OR you can message me at any time.  We'll look into General Surgeons for the wrist cyst removal. Stay tuned. Keep an eye on this to see if it improves or reduces on it's down but I dont think it will ever go away completely.  Colon Cancer Screening: - For all adults age 50+ routine colon cancer screening is highly recommended.     - Recent guidelines from [place] recommend starting age of 23 - Early detection of colon cancer is important, because often there are no warning signs or symptoms, also if found early usually it can be cured. Late stage is hard to treat. - Special circumstances in patients with early family history of colon cancer, we recommend colonoscopy at a younger age (usually 41 years before the family member was diagnosed).  - Colonoscopy is the best test for colon cancer because it involves direct visualization and immediate treatment (compared to other imaging studies or stool cards to test for blood, that will require you to eventually get a colonoscopy if they are abnormal). - Also to consider, Cologuard is an excellent alternative for screening test for Colon Cancer. It is highly sensitive for detecting DNA of colon cancer from even the earliest stages. Also, there is NO bowel prep required. -------------------------  If you would like to proceed with Colonoscopy, then please notify our office by message or phone call, and we will order referral to Gastroenterology    Please schedule a Follow-up Appointment to: Return in about 2 months (around 09/05/2019) for keep scheduled apt, wrist / colonoscopy.  If you have any other questions or concerns, please feel free to call the office or send a message through [place]. You may also schedule an earlier appointment if necessary.  Additionally, you may be receiving a survey about your experience at our office within a few days to 1  week by e-mail or mail. We value your feedback.  Nobie Putnam, DO [place]

## 2019-08-29 ENCOUNTER — Other Ambulatory Visit: Payer: BC Managed Care – PPO

## 2019-09-05 ENCOUNTER — Ambulatory Visit: Payer: BC Managed Care – PPO | Admitting: Family Medicine

## 2019-10-11 ENCOUNTER — Other Ambulatory Visit: Payer: BC Managed Care – PPO

## 2019-11-08 DIAGNOSIS — Z23 Encounter for immunization: Secondary | ICD-10-CM | POA: Diagnosis not present

## 2020-08-28 ENCOUNTER — Ambulatory Visit: Payer: Self-pay | Admitting: *Deleted

## 2020-08-28 NOTE — Telephone Encounter (Signed)
Summary: right elbow pain   Pt called for a referral for right elbow pain/ he mention a fall from skating but ut was not so recent/ he also mentioned it could be from bowling or playing the piano more/ pt stated that he mentioned the feeling of a chipped bone in his elbow / he said he mentioned it to Dr.K before but pt hasnt seen Dr. Raliegh Ip in about a year/ dont know if pt needs to speak to NT / please advise if so      Called patient to review symptoms. No answer , recording states "subscriber not in service " "try call again later". Unable to leave message.

## 2020-08-28 NOTE — Telephone Encounter (Signed)
Called patient's wife at [phone] due to [phone] not in service. No answer. Left voicemail to call clinic back .

## 2020-09-02 ENCOUNTER — Other Ambulatory Visit: Payer: Self-pay

## 2020-09-02 ENCOUNTER — Encounter: Payer: Self-pay | Admitting: Family Medicine

## 2020-09-02 ENCOUNTER — Ambulatory Visit (INDEPENDENT_AMBULATORY_CARE_PROVIDER_SITE_OTHER): Payer: 59 | Admitting: Family Medicine

## 2020-09-02 ENCOUNTER — Ambulatory Visit
Admission: RE | Admit: 2020-09-02 | Discharge: 2020-09-02 | Disposition: A | Discharge status: Home / Self Care | Payer: 59 | Source: Ambulatory Visit | Attending: Family Medicine | Admitting: Family Medicine

## 2020-09-02 ENCOUNTER — Ambulatory Visit
Admission: RE | Admit: 2020-09-02 | Discharge: 2020-09-02 | Disposition: A | Discharge status: Home / Self Care | Payer: 59 | Attending: Family Medicine | Admitting: Family Medicine

## 2020-09-02 VITALS — BP 129/88 | HR 65 | Ht 73.0 in | Wt 242.2 lb

## 2020-09-02 DIAGNOSIS — M151 Heberden's nodes (with arthropathy): Secondary | ICD-10-CM

## 2020-09-02 DIAGNOSIS — M79631 Pain in right forearm: Secondary | ICD-10-CM | POA: Diagnosis not present

## 2020-09-02 DIAGNOSIS — M79644 Pain in right finger(s): Secondary | ICD-10-CM | POA: Diagnosis present

## 2020-09-02 DIAGNOSIS — M7711 Lateral epicondylitis, right elbow: Secondary | ICD-10-CM | POA: Diagnosis not present

## 2020-09-02 NOTE — Progress Notes (Signed)
Subjective:    Patient ID: Kenneth Young, male    DOB: 1970/12/03, 50 y.o.   MRN: [id number]  Kenneth Young is a 50 y.o. male presenting on 09/02/2020 for Arm Pain and Hand Pain   HPI  Right Lateral Epicondylitis Right Forearm / Elbow Pain Lifestyle changes - He said he has taken the past 1 year off. He was working for stressful job previously. In past 1 year, he has done more activities including gardening, bowling, piano playing.  Reports back in April 2022 he says he may have strained his R forearm/elbow, and has had a gradual persistent pain. He said it does bother him at times while playing piano and also while bowling. - Says problem present now for 3 months. Occasionally will take Advil PRN to help sleep, sometimes wakes him up. Only bothers him now if play piano then stop. Or actively bowling or doing something that requires rotation of his forearm. - He has a compression wrap for forearm at times without relief. No other trauma or injury Denies redness swelling ecchymosis other joint or extremity pain   Right Thumb Nodule Reports new problem identified in past several months with growing increased size nodular density of Right dorsal thumb over thumb joint, only on one side of thumb. He admits thumb pain and aching in joint and no injury    Depression screen [facility] 2/9 09/02/2020 06/22/2019 03/08/2019  Decreased Interest 0 0 0  Down, Depressed, Hopeless 0 0 0  PHQ - 2 Score 0 0 0  Altered sleeping 0 - 0  Tired, decreased energy 0 - 0  Change in appetite 0 - 0  Feeling bad or failure about yourself  0 - 0  Trouble concentrating 0 - 0  Moving slowly or fidgety/restless 0 - 0  Suicidal thoughts 0 - 0  PHQ-9 Score 0 - 0  Difficult doing work/chores Not difficult at all - Not difficult at all    Social History   Tobacco Use   Smoking status: Former    Packs/day: 1.00    Years: 10.00    Pack years: 10.00    Types: Cigarettes    Quit date: 03/01/1992    Years since  quitting: 28.5   Smokeless tobacco: Former   Tobacco comments:    Former history smoked for 10 years, then quit  Vaping Use   Vaping Use: Former   Start date: 03/01/2005   Quit date: 12/31/2015  Substance Use Topics   Alcohol use: Yes    Alcohol/week: 2.0 standard drinks    Types: 2 Cans of beer per week    Comment: Limited to beer only   Drug use: Yes    Types: Marijuana    Comment: 3x weekly, recreational / relaxation    Review of Systems Per HPI unless specifically indicated above     Objective:    BP 129/88   Pulse 65   Ht 6\' 1"  (1.854 m)   Wt 242 lb 3.2 oz (109.9 kg)   SpO2 97%   BMI 31.95 kg/m   Wt Readings from Last 3 Encounters:  09/02/20 242 lb 3.2 oz (109.9 kg)  06/22/19 223 lb 9.6 oz (101.4 kg)  03/08/19 236 lb (107 kg)    Physical Exam Vitals and nursing note reviewed.  Constitutional:      General: He is not in acute distress.    Appearance: Normal appearance. He is well-developed. He is not diaphoretic.     Comments: Well-appearing, comfortable, cooperative  HENT:  Head: Normocephalic and atraumatic.  Eyes:     General:        Right eye: No discharge.        Left eye: No discharge.     Conjunctiva/sclera: Conjunctivae normal.  Cardiovascular:     Rate and Rhythm: Normal rate.  Pulmonary:     Effort: Pulmonary effort is normal.  Skin:    General: Skin is warm and dry.     Findings: No erythema or rash.  Neurological:     Mental Status: He is alert and oriented to person, place, and time.  Psychiatric:        Mood and Affect: Mood normal.        Behavior: Behavior normal.        Thought Content: Thought content normal.     Comments: Well groomed, good eye contact, normal speech and thoughts   Results for orders placed or performed in visit on 03/05/19  physical Lipid panel  Result Value Ref Range   Cholesterol 197 <200 mg/dL   HDL 32 (L) > OR = 40 mg/dL   Triglycerides 369 (H) <150 mg/dL   LDL Cholesterol (Calc) 112 (H) mg/dL (calc)    Total CHOL/HDL Ratio 6.2 (H) <5.0 (calc)   Non-HDL Cholesterol (Calc) 165 (H) <130 mg/dL (calc)  cmet with GFR physical  Result Value Ref Range   Glucose, Bld 106 (H) 65 - 99 mg/dL   BUN 14 7 - 25 mg/dL   Creat 1.04 0.60 - 1.35 mg/dL   GFR, Est Non African American 84 > OR = 60 mL/min/1.66m2   GFR, Est African American 97 > OR = 60 mL/min/1.55m2   BUN/Creatinine Ratio NOT APPLICABLE 6 - 22 (calc)   Sodium 138 135 - 146 mmol/L   Potassium 4.2 3.5 - 5.3 mmol/L   Chloride 107 98 - 110 mmol/L   CO2 22 20 - 32 mmol/L   Calcium 9.4 8.6 - 10.3 mg/dL   Total Protein 7.4 6.1 - 8.1 g/dL   Albumin 4.6 3.6 - 5.1 g/dL   Globulin 2.8 1.9 - 3.7 g/dL (calc)   AG Ratio 1.6 1.0 - 2.5 (calc)   Total Bilirubin 0.4 0.2 - 1.2 mg/dL   Alkaline phosphatase (APISO) 60 36 - 130 U/L   AST 29 10 - 40 U/L   ALT 51 (H) 9 - 46 U/L  physical HgB A1c  Result Value Ref Range   Hgb A1c MFr Bld 5.7 (H) <5.7 % of total Hgb   Mean Plasma Glucose 117 (calc)   eAG (mmol/L) 6.5 (calc)  physical CBC  Result Value Ref Range   WBC 7.1 3.8 - 10.8 Thousand/uL   RBC 5.07 4.20 - 5.80 Million/uL   Hemoglobin 14.7 13.2 - 17.1 g/dL   HCT 43.6 38.5 - 50.0 %   MCV 86.0 80.0 - 100.0 fL   MCH 29.0 27.0 - 33.0 pg   MCHC 33.7 32.0 - 36.0 g/dL   RDW 13.0 11.0 - 15.0 %   Platelets 150 140 - 400 Thousand/uL   MPV 11.9 7.5 - 12.5 fL   Neutro Abs 4,182 1,500 - 7,800 cells/uL   Lymphs Abs 1,917 850 - 3,900 cells/uL   Absolute Monocytes 753 200 - 950 cells/uL   Eosinophils Absolute 192 15 - 500 cells/uL   Basophils Absolute 57 0 - 200 cells/uL   Neutrophils Relative % 58.9 %   Total Lymphocyte 27.0 %   Monocytes Relative 10.6 %   Eosinophils Relative 2.7 %  Basophils Relative 0.8 %      Assessment & Plan:   Problem List Items Addressed This Visit   None Visit Diagnoses     Right lateral epicondylitis    -  Primary   [place]ber[place]'s nodes of right hand       Relevant Orders   DG Finger Thumb Right   Right forearm pain        Pain of right thumb       Relevant Orders   DG Finger Thumb Right       Clinically R forearm tendonitis, more specifically lateral epicondylitis given location and provoking factors on exam - Likely acute on chronic flare due to repetitive daily activity with changes now in past year with frequent piano, bowling and other activities that are repetitive. Suspect some underlying OA/DJD as well. - Unlikely to be acute muscle tear or fracture  Plan - Reviewed precautions with diagnosis and goal to allow it to heal / modify activity - Start OTC Aleve / Naproxen NSAID 500mg  BID wc x 1-2 weeks, then PRN - Take Tylenol up to 1g TID PRN breakthrough - Recommend RICE therapy, emphasis on Forearm Strap to help support the muscle limit repetitive strain, may use ACE wrap for compression - Future PT if indicated - Handout given on exercises at home  Future refer to [place] if unresolved - to Dr Zigmund Daniel.  #R Thumb Nodule  Likely arthritis nodule on thumb joint Repetitive activities, see above Will check X-ray R Thumb today  Orders Placed This Encounter  Procedures   DG Finger Thumb Right    Standing Status:   Future    Number of Occurrences:   1    Standing Expiration Date:   09/02/2021    Order Specific Question:   Reason for Exam (SYMPTOM  OR DIAGNOSIS REQUIRED)    Answer:   Right thumb pain and bony nodule of joint, evaluate for osteoarthritis    Order Specific Question:   Preferred imaging location?    Answer:   [facility]      No orders of the defined types were placed in this encounter.     Follow up plan: Return in about 6 weeks (around 10/14/2020), or if symptoms worsen or fail to improve, for 4-6 weeks follow-up Tennis Elbow and Thumb pain.   Nobie Putnam, [facility] 09/02/2020, 9:52 AM

## 2020-09-02 NOTE — Patient Instructions (Addendum)
Thank you for coming to the office today.  Thumb likely arthritis  START anti inflammatory topical - OTC Voltaren (generic Diclofenac) topical 2-4 times a day as needed for pain swelling of affected joint for 1-2 weeks or longer.  You most likely have "Tennis Elbow" or "Lateral Epicondylitis" - This is inflammation or tendonitis affecting the muscles of your forearm - Usually it is caused by frequent or daily repetitive activities using your arms, lifting, rotating, pulling, twisting - it can happen over days to months or longer from repetitive strain  One of the most important parts of treatment is rest and avoiding the activities that make it worse.  Recommend trial of Anti-inflammatory with Naproxen or Aleve OTC 220 or 250mg  tabs - take 1 to 2 with food and plenty of water TWICE daily every day (breakfast and dinner), for next 1 to 2 weeks, then you may take only as needed - DO NOT TAKE any ibuprofen, motrin while you are taking this medicine - It is safe to take Tylenol Ext Str 500mg  tabs - take 1 to 2 (max dose 1000mg ) every 6 hours as needed for breakthrough pain, max 24 hour daily dose is 6 to 8 tablets or 4000mg   Use RICE therapy: - R - Rest / relative rest with activity modification avoid overuse of joint - I - Ice packs (make sure you use a towel or sock / something to protect skin) - C - Compression with ACE wrap to apply pressure and reduce swelling allowing more support  Try a Forearm Tennis Elbow Strap over muscle as demonstrated - use with repetitive activity to reduce strain of muscle    - E - Elevation - if significant swelling, lift leg above heart level (toes above your nose) to help reduce swelling, most helpful at night after day of being on your feet  May consider Physical Therapy referral if interested   Please schedule a Follow-up Appointment to: Return in about 6 weeks (around 10/14/2020), or if symptoms worsen or fail to improve, for 4-6 weeks follow-up Tennis  Elbow and Thumb pain.  If you have any other questions or concerns, please feel free to call the office or send a message through [facility]. You may also schedule an earlier appointment if necessary.  Additionally, you may be receiving a survey about your experience at our office within a few days to 1 week by e-mail or mail. We value your feedback.  Nobie Putnam, DO [facility], [facility]  Tennis Elbow Rehab Ask your health care provider which exercises are safe for you. Do exercises exactly as told by your health care provider and adjust them as directed. It is normal to feel mild stretching, pulling, tightness, or discomfort as you do these exercises. Stop right away if you feel sudden pain or your pain gets worse. Do not begin these exercises until told by your health care provider. Stretching and range-of-motion exercises These exercises warm up your muscles and joints and improve the movement andflexibility of your elbow. Wrist flexion, assisted  Straighten your left / right elbow in front of you with your palm facing down toward the floor. If told by your health care provider, bend your left / right elbow to a 90-degree angle (right angle) at your side instead of holding it straight. With your other hand, gently push over the back of your left / right hand so your fingers point toward the floor (flexion). Stop when you feel a gentle stretch on the back  of your forearm. Hold this position for __________ seconds. Repeat __________ times. Complete this exercise __________ times a day. Wrist extension, assisted  Straighten your left / right elbow in front of you with your palm facing up toward the ceiling. If told by your health care provider, bend your left / right elbow to a 90-degree angle (right angle) at your side instead of holding it straight. With your other hand, gently pull your left / right hand and fingers toward the floor (extension). Stop when you feel a  gentle stretch on the palm side of your forearm. Hold this position for __________ seconds. Repeat __________ times. Complete this exercise __________ times a day. Assisted forearm rotation, supination Sit or stand with your elbows at your side. Bend your left / right elbow to a 90-degree angle (right angle). Using your uninjured hand, turn your left / right palm up toward the ceiling (supination) until you feel a gentle stretch along the inside of your forearm. Hold this position for __________ seconds. Repeat __________ times. Complete this exercise __________ times a day. Assisted forearm rotation, pronation Sit or stand with your elbows at your side. Bend your left / right elbow to a 90-degree angle (right angle). Using your uninjured hand, turn your left / right palm down toward the floor (pronation) until you feel a gentle stretch along the outside of your forearm. Hold this position for __________ seconds. Repeat __________ times. Complete this exercise __________ times a day. Strengthening exercises These exercises build strength and endurance in your forearm and elbow. Endurance is the ability to use your muscles for a long time, even after theyget tired. Radial deviation  Stand with a __________ weight or a hammer in your left / right hand. Or, sit while holding a rubber exercise band or tubing, with your left / right forearm supported on a table or countertop. Position your forearm so that the thumb is facing the ceiling, as if you are going to clap your hands. This is the neutral position. Raise your hand upward in front of you so your thumb moves toward the ceiling (radial deviation), or pull up on the rubber tubing. Keep your forearm and elbow still while you move your wrist only. Hold this position for __________ seconds. Slowly return to the starting position. Repeat __________ times. Complete this exercise __________ times a day. Wrist extension, eccentric Sit with your  left / right forearm palm-down and supported on a table or other surface. Let your left / right wrist extend over the edge of the surface. Hold a __________ weight or a piece of exercise band or tubing in your left / right hand. If using a rubber exercise band or tubing, hold the other end of the tubing with your other hand. Use your uninjured hand to move your left / right hand up toward the ceiling. Take your uninjured hand away and slowly return to the starting position using only your left / right hand. Lowering your arm under tension is called eccentric extension. Repeat __________ times. Complete this exercise __________ times a day. Wrist extension Do not do this exercise if it causes pain at the outside of your elbow. Only do this exercise once instructed by your health care provider. Sit with your left / right forearm supported on a table or other surface and your palm turned down toward the floor. Let your left / right wrist extend over the edge of the surface. Hold a __________ weight or a piece of rubber exercise band  or tubing. If you are using a rubber exercise band or tubing, hold the band or tubing in place with your other hand to provide resistance. Slowly bend your wrist so your hand moves up toward the ceiling (extension). Move only your wrist, keeping your forearm and elbow still. Hold this position for __________ seconds. Slowly return to the starting position. Repeat __________ times. Complete this exercise __________ times a day. Forearm rotation, supination To do this exercise, you will need a lightweight hammer or rubber mallet. Sit with your left / right forearm supported on a table or other surface. Bend your elbow to a 90-degree angle (right angle). Position your forearm so that your palm is facing down toward the floor, with your hand resting over the edge of the table. Hold a hammer in your left / right hand. To make this exercise easier, hold the hammer near the head  of the hammer. To make this exercise harder, hold the hammer near the end of the handle. Without moving your wrist or elbow, slowly rotate your forearm so your palm faces up toward the ceiling (supination). Hold this position for __________ seconds. Slowly return to the starting position. Repeat __________ times. Complete this exercise __________ times a day. Shoulder blade squeeze Sit in a stable chair or stand with good posture. If you are sitting down, do not let your back touch the back of the chair. Your arms should be at your sides with your elbows bent to a 90-degree angle (right angle). Position your forearms so that your thumbs are facing the ceiling (neutral position). Without lifting your shoulders up, squeeze your shoulder blades tightly together. Hold this position for __________ seconds. Slowly release and return to the starting position. Repeat __________ times. Complete this exercise __________ times a day. This information is not intended to replace advice given to you by your health care provider. Make sure you discuss any questions you have with your healthcare provider. Document Revised: 05/09/2019 Document Reviewed: 05/09/2019 Elsevier Patient Education  [place].

## 2021-04-20 ENCOUNTER — Other Ambulatory Visit: Payer: Self-pay

## 2021-04-20 ENCOUNTER — Encounter: Payer: Self-pay | Admitting: Family Medicine

## 2021-04-20 ENCOUNTER — Ambulatory Visit (INDEPENDENT_AMBULATORY_CARE_PROVIDER_SITE_OTHER): Payer: 59 | Admitting: Family Medicine

## 2021-04-20 VITALS — BP 128/78 | HR 70 | Ht 73.0 in | Wt 231.4 lb

## 2021-04-20 DIAGNOSIS — Z1159 Encounter for screening for other viral diseases: Secondary | ICD-10-CM | POA: Diagnosis not present

## 2021-04-20 DIAGNOSIS — Z1211 Encounter for screening for malignant neoplasm of colon: Secondary | ICD-10-CM | POA: Diagnosis not present

## 2021-04-20 DIAGNOSIS — R7309 Other abnormal glucose: Secondary | ICD-10-CM

## 2021-04-20 DIAGNOSIS — Z0000 Encounter for general adult medical examination without abnormal findings: Secondary | ICD-10-CM | POA: Diagnosis not present

## 2021-04-20 DIAGNOSIS — Z125 Encounter for screening for malignant neoplasm of prostate: Secondary | ICD-10-CM

## 2021-04-20 DIAGNOSIS — E782 Mixed hyperlipidemia: Secondary | ICD-10-CM

## 2021-04-20 NOTE — Patient Instructions (Addendum)
Thank you for coming to the office today.  Check Blood Pressure at home goal < 135 / 85 ideally.  Labs today  Shingrix vaccine at pharmacy 2 doses, 2-6 month apart, has some flu like side effects potential  Colon Cancer Screening: - For all adults age 52+ routine colon cancer screening is highly recommended.     - Recent guidelines from [place] recommend starting age of 26 - Early detection of colon cancer is important, because often there are no warning signs or symptoms, also if found early usually it can be cured. Late stage is hard to treat. - Special circumstances in patients with early family history of colon cancer, we recommend colonoscopy at a younger age (usually 47 years before the family member was diagnosed).  - Colonoscopy is the best test for colon cancer because it involves direct visualization and immediate treatment (compared to other imaging studies or stool cards to test for blood, that will require you to eventually get a colonoscopy if they are abnormal). - Also to consider, Cologuard is an excellent alternative for screening test for Colon Cancer. It is highly sensitive for detecting DNA of colon cancer from even the earliest stages. Also, there is NO bowel prep required. -------------------------  Referral to GI for initial Colonoscopy   [facility]) [place], [place] [address] Phone: [phone]  --------------------   Please schedule a Follow-up Appointment to: Return in about 1 year (around 04/20/2022) for 1 year Annual Physical AM apt fasting lab after.  If you have any other questions or concerns, please feel free to call the office or send a message through [place]. You may also schedule an earlier appointment if necessary.  Additionally, you may be receiving a survey about your experience at our office within a few days to 1 week by e-mail or mail. We value your  feedback.  Nobie Putnam, DO [facility], [facility]  DASH Eating Plan DASH stands for Dietary Approaches to Stop Hypertension. The DASH eating plan is a healthy eating plan that has been shown to: Reduce high blood pressure (hypertension). Reduce your risk for type 2 diabetes, heart disease, and stroke. Help with weight loss. What are tips for following this plan? Reading food labels Check food labels for the amount of salt (sodium) per serving. Choose foods with less than 5 percent of the Daily Value of sodium. Generally, foods with less than 300 milligrams (mg) of sodium per serving fit into this eating plan. To find whole grains, look for the word "whole" as the first word in the ingredient list. Shopping Buy products labeled as "low-sodium" or "no salt added." Buy fresh foods. Avoid canned foods and pre-made or frozen meals. Cooking Avoid adding salt when cooking. Use salt-free seasonings or herbs instead of table salt or sea salt. Check with your health care provider or pharmacist before using salt substitutes. Do not fry foods. Cook foods using healthy methods such as baking, boiling, grilling, roasting, and broiling instead. Cook with heart-healthy oils, such as olive, canola, avocado, soybean, or sunflower oil. Meal planning  Eat a balanced diet that includes: 4 or more servings of fruits and 4 or more servings of vegetables each day. Try to fill one-half of your plate with fruits and vegetables. 6-8 servings of whole grains each day. Less than 6 oz (170 g) of lean meat, poultry, or fish each day. A 3-oz (85-g) serving of meat is about the same size as  a deck of cards. One egg equals 1 oz (28 g). 2-3 servings of low-fat dairy each day. One serving is 1 cup (237 mL). 1 serving of nuts, seeds, or beans 5 times each week. 2-3 servings of heart-healthy fats. Healthy fats called omega-3 fatty acids are found in foods such as walnuts, flaxseeds, fortified milks, and  eggs. These fats are also found in cold-water fish, such as sardines, salmon, and mackerel. Limit how much you eat of: Canned or prepackaged foods. Food that is high in trans fat, such as some fried foods. Food that is high in saturated fat, such as fatty meat. Desserts and other sweets, sugary drinks, and other foods with added sugar. Full-fat dairy products. Do not salt foods before eating. Do not eat more than 4 egg yolks a week. Try to eat at least 2 vegetarian meals a week. Eat more home-cooked food and less restaurant, buffet, and fast food. Lifestyle When eating at a restaurant, ask that your food be prepared with less salt or no salt, if possible. If you drink alcohol: Limit how much you use to: 0-1 drink a day for women who are not pregnant. 0-2 drinks a day for men. Be aware of how much alcohol is in your drink. In the [place] one drink equals one 12 oz bottle of beer (355 mL), one 5 oz glass of wine (148 mL), or one 1 oz glass of hard liquor (44 mL). General information Avoid eating more than 2,300 mg of salt a day. If you have hypertension, you may need to reduce your sodium intake to 1,500 mg a day. Work with your health care provider to maintain a healthy body weight or to lose weight. Ask what an ideal weight is for you. Get at least 30 minutes of exercise that causes your heart to beat faster (aerobic exercise) most days of the week. Activities may include walking, swimming, or biking. Work with your health care provider or dietitian to adjust your eating plan to your individual calorie needs. What foods should I eat? Fruits All fresh, dried, or frozen fruit. Canned fruit in natural juice (without added sugar). Vegetables Fresh or frozen vegetables (raw, steamed, roasted, or grilled). Low-sodium or reduced-sodium tomato and vegetable juice. Low-sodium or reduced-sodium tomato sauce and tomato paste. Low-sodium or reduced-sodium canned vegetables. Grains Whole-grain or  whole-wheat bread. Whole-grain or whole-wheat pasta. Brown rice. [place]. Bulgur. Whole-grain and low-sodium cereals. Pita bread. Low-fat, low-sodium crackers. Whole-wheat flour tortillas. Meats and other proteins Skinless chicken or [place]. Ground chicken or [place]. Pork with fat trimmed off. Fish and seafood. Egg whites. Dried beans, peas, or lentils. Unsalted nuts, nut butters, and seeds. Unsalted canned beans. Lean cuts of beef with fat trimmed off. Low-sodium, lean precooked or cured meat, such as sausages or meat loaves. Dairy Low-fat (1%) or fat-free (skim) milk. Reduced-fat, low-fat, or fat-free cheeses. Nonfat, low-sodium ricotta or cottage cheese. Low-fat or nonfat yogurt. Low-fat, low-sodium cheese. Fats and oils Soft margarine without trans fats. Vegetable oil. Reduced-fat, low-fat, or light mayonnaise and salad dressings (reduced-sodium). Canola, safflower, olive, avocado, soybean, and sunflower oils. Avocado. Seasonings and condiments Herbs. Spices. Seasoning mixes without salt. Other foods Unsalted popcorn and pretzels. Fat-free sweets. The items listed above may not be a complete list of foods and beverages you can eat. Contact a dietitian for more information. What foods should I avoid? Fruits Canned fruit in a light or heavy syrup. Fried fruit. Fruit in cream or butter sauce. Vegetables Creamed or fried vegetables. Vegetables  in a cheese sauce. Regular canned vegetables (not low-sodium or reduced-sodium). Regular canned tomato sauce and paste (not low-sodium or reduced-sodium). Regular tomato and vegetable juice (not low-sodium or reduced-sodium). Angie Fava. Olives. Grains Baked goods made with fat, such as croissants, muffins, or some breads. Dry pasta or rice meal packs. Meats and other proteins Fatty cuts of meat. Ribs. Fried meat. Berniece Salines. Bologna, salami, and other precooked or cured meats, such as sausages or meat loaves. Fat from the back of a pig (fatback).  Bratwurst. Salted nuts and seeds. Canned beans with added salt. Canned or smoked fish. Whole eggs or egg yolks. Chicken or [place] with skin. Dairy Whole or 2% milk, cream, and half-and-half. Whole or full-fat cream cheese. Whole-fat or sweetened yogurt. Full-fat cheese. Nondairy creamers. Whipped toppings. Processed cheese and cheese spreads. Fats and oils Butter. Stick margarine. Lard. Shortening. Ghee. Bacon fat. Tropical oils, such as coconut, palm kernel, or palm oil. Seasonings and condiments Onion salt, garlic salt, seasoned salt, table salt, and sea salt. Worcestershire sauce. Tartar sauce. Barbecue sauce. Teriyaki sauce. Soy sauce, including reduced-sodium. Steak sauce. Canned and packaged gravies. Fish sauce. Oyster sauce. Cocktail sauce. Store-bought horseradish. Ketchup. Mustard. Meat flavorings and tenderizers. Bouillon cubes. Hot sauces. Pre-made or packaged marinades. Pre-made or packaged taco seasonings. Relishes. Regular salad dressings. Other foods Salted popcorn and pretzels. The items listed above may not be a complete list of foods and beverages you should avoid. Contact a dietitian for more information. Where to find more information National Heart, Lung, and Blood Institute: https://wilson-eaton.com/ American Heart Association: www.heart.org Academy of Nutrition and Dietetics: www.eatright.[place]: www.kidney.org Summary The DASH eating plan is a healthy eating plan that has been shown to reduce high blood pressure (hypertension). It may also reduce your risk for type 2 diabetes, heart disease, and stroke. When on the DASH eating plan, aim to eat more fresh fruits and vegetables, whole grains, lean proteins, low-fat dairy, and heart-healthy fats. With the DASH eating plan, you should limit salt (sodium) intake to 2,300 mg a day. If you have hypertension, you may need to reduce your sodium intake to 1,500 mg a day. Work with your health care provider or  dietitian to adjust your eating plan to your individual calorie needs. This information is not intended to replace advice given to you by your health care provider. Make sure you discuss any questions you have with your health care provider. Document Revised: 01/19/2019 Document Reviewed: 01/19/2019 Elsevier Patient Education  2022 [employer].

## 2021-04-20 NOTE — Progress Notes (Signed)
Subjective:    Patient ID: Kenneth Young, male    DOB: April 21, 1970, 51 y.o.   MRN: [id number]  Kenneth Young is a 51 y.o. male presenting on 04/20/2021 for Annual Exam   HPI   Here for Annual Physical and Lab Review.  Elevated BP Reports initial BP mild elevation 140/88, previously has had occasional 88 on DBP, not checking BP at home yet. Current Meds - None Lifestyle: - Diet: goal to limit sodium in future - Exercise: improving Denies CP, dyspnea, HA, edema, dizziness / lightheadedness   HYPERLIPIDEMIA: - Last lipid panel 03/2019, low HDL and elevated LDL 112 and elevated TG >300 Not taking any cholesterol med   Pre-Diabetes / Obesity BMI >30 Reports no concerns previously, elevated A1c 5.7 on last result. Meds: never on med Lifestyle: - Diet (not adhering any particular diet, goal to improve)  Denies hypoglycemia, polyuria, visual changes, numbness or tingling.    PMH External Hemorrhoids   Taking Vitamin D immune benefit.     Health Maintenance:   Prostate CA Screening: No prior prostate CA screening. Currently asymptomatic. No known family history of prostate CA. Due for screening.   Colon CA Screening: Never had colonoscopy. Currently asymptomatic except external hemorrhoid see above. No known family history of colon CA. Due for screening test considering Cologuard, counseling given  Hep C screening due.   Depression screen [facility] 2/9 09/02/2020 06/22/2019 03/08/2019  Decreased Interest 0 0 0  Down, Depressed, Hopeless 0 0 0  PHQ - 2 Score 0 0 0  Altered sleeping 0 - 0  Tired, decreased energy 0 - 0  Change in appetite 0 - 0  Feeling bad or failure about yourself  0 - 0  Trouble concentrating 0 - 0  Moving slowly or fidgety/restless 0 - 0  Suicidal thoughts 0 - 0  PHQ-9 Score 0 - 0  Difficult doing work/chores Not difficult at all - Not difficult at all    Past Medical History:  Diagnosis Date   Genital herpes    Hemorrhoid    Past Surgical History:   Procedure Laterality Date   ORIF PROXIMAL TIBIAL PLATEAU FRACTURE     WISDOM TOOTH EXTRACTION     Social History   Socioeconomic History   Marital status: Married    Spouse name: Not on file   Number of children: 1   Years of education: College   Highest education level: Not on file  Occupational History   Occupation: Aeronautical engineer    Comment: Computers, Has degree in Classical Greek/Latin  Tobacco Use   Smoking status: Former    Packs/day: 1.00    Years: 10.00    Pack years: 10.00    Types: Cigarettes    Quit date: 03/01/1992    Years since quitting: 29.1   Smokeless tobacco: Former   Tobacco comments:    Former history smoked for 10 years, then quit  Vaping Use   Vaping Use: Former   Start date: 03/01/2005   Quit date: 12/31/2015  Substance and Sexual Activity   Alcohol use: Yes    Alcohol/week: 2.0 standard drinks    Types: 2 Cans of beer per week    Comment: Limited to beer only   Drug use: Yes    Types: Marijuana    Comment: 3x weekly, recreational / relaxation   Sexual activity: Not on file  Other Topics Concern   Not on file  Social History Narrative   Not on file   Social Determinants of  Health   Financial Resource Strain: Not on file  Food Insecurity: Not on file  Transportation Needs: Not on file  Physical Activity: Not on file  Stress: Not on file  Social Connections: Not on file  Intimate Partner Violence: Not on file   Family History  Problem Relation Age of Onset   Cataracts Mother    Osteoarthritis Mother        Bilateral Hip Replacement Surgery   Hypertension Brother    Prostate cancer Neg Hx    Colon cancer Neg Hx    No current outpatient medications on file prior to visit.   No current facility-administered medications on file prior to visit.    Review of Systems  Constitutional:  Negative for activity change, appetite change, chills, diaphoresis, fatigue and fever.  HENT:  Negative for congestion and hearing loss.   Eyes:   Negative for visual disturbance.  Respiratory:  Negative for cough, chest tightness, shortness of breath and wheezing.   Cardiovascular:  Negative for chest pain, palpitations and leg swelling.  Gastrointestinal:  Negative for abdominal pain, constipation, diarrhea, nausea and vomiting.  Genitourinary:  Negative for dysuria, frequency and hematuria.  Musculoskeletal:  Negative for arthralgias and neck pain.  Skin:  Negative for rash.  Neurological:  Negative for dizziness, weakness, light-headedness, numbness and headaches.  Hematological:  Negative for adenopathy.  Psychiatric/Behavioral:  Negative for behavioral problems, dysphoric mood and sleep disturbance.   Per HPI unless specifically indicated above      Objective:    BP 128/78 (BP Location: Left Arm, Cuff Size: Normal)    Pulse 70    Ht 6\' 1"  (1.854 m)    Wt 231 lb 6.4 oz (105 kg)    SpO2 97%    BMI 30.53 kg/m   Wt Readings from Last 3 Encounters:  04/20/21 231 lb 6.4 oz (105 kg)  09/02/20 242 lb 3.2 oz (109.9 kg)  06/22/19 223 lb 9.6 oz (101.4 kg)    Physical Exam Vitals and nursing note reviewed.  Constitutional:      General: He is not in acute distress.    Appearance: He is well-developed. He is not diaphoretic.     Comments: Well-appearing, comfortable, cooperative  HENT:     Head: Normocephalic and atraumatic.  Eyes:     General:        Right eye: No discharge.        Left eye: No discharge.     Conjunctiva/sclera: Conjunctivae normal.     Pupils: Pupils are equal, round, and reactive to light.  Neck:     Thyroid: No thyromegaly.  Cardiovascular:     Rate and Rhythm: Normal rate and regular rhythm.     Pulses: Normal pulses.     Heart sounds: Normal heart sounds. No murmur heard. Pulmonary:     Effort: Pulmonary effort is normal. No respiratory distress.     Breath sounds: Normal breath sounds. No wheezing or rales.  Abdominal:     General: Bowel sounds are normal. There is no distension.     Palpations:  Abdomen is soft. There is no mass.     Tenderness: There is no abdominal tenderness.  Musculoskeletal:        General: No tenderness. Normal range of motion.     Cervical back: Normal range of motion and neck supple.     Comments: Upper / Lower Extremities: - Normal muscle tone, strength bilateral upper extremities 5/5, lower extremities 5/5  Lymphadenopathy:  Cervical: No cervical adenopathy.  Skin:    General: Skin is warm and dry.     Findings: No erythema or rash.  Neurological:     Mental Status: He is alert and oriented to person, place, and time.     Comments: Distal sensation intact to light touch all extremities  Psychiatric:        Mood and Affect: Mood normal.        Behavior: Behavior normal.        Thought Content: Thought content normal.     Comments: Well groomed, good eye contact, normal speech and thoughts     Results for orders placed or performed in visit on 03/05/19  physical Lipid panel  Result Value Ref Range   Cholesterol 197 <200 mg/dL   HDL 32 (L) > OR = 40 mg/dL   Triglycerides 369 (H) <150 mg/dL   LDL Cholesterol (Calc) 112 (H) mg/dL (calc)   Total CHOL/HDL Ratio 6.2 (H) <5.0 (calc)   Non-HDL Cholesterol (Calc) 165 (H) <130 mg/dL (calc)  cmet with GFR physical  Result Value Ref Range   Glucose, Bld 106 (H) 65 - 99 mg/dL   BUN 14 7 - 25 mg/dL   Creat 1.04 0.60 - 1.35 mg/dL   GFR, Est Non African American 84 > OR = 60 mL/min/1.68m2   GFR, Est African American 97 > OR = 60 mL/min/1.61m2   BUN/Creatinine Ratio NOT APPLICABLE 6 - 22 (calc)   Sodium 138 135 - 146 mmol/L   Potassium 4.2 3.5 - 5.3 mmol/L   Chloride 107 98 - 110 mmol/L   CO2 22 20 - 32 mmol/L   Calcium 9.4 8.6 - 10.3 mg/dL   Total Protein 7.4 6.1 - 8.1 g/dL   Albumin 4.6 3.6 - 5.1 g/dL   Globulin 2.8 1.9 - 3.7 g/dL (calc)   AG Ratio 1.6 1.0 - 2.5 (calc)   Total Bilirubin 0.4 0.2 - 1.2 mg/dL   Alkaline phosphatase (APISO) 60 36 - 130 U/L   AST 29 10 - 40 U/L   ALT 51 (H) 9 - 46  U/L  physical HgB A1c  Result Value Ref Range   Hgb A1c MFr Bld 5.7 (H) <5.7 % of total Hgb   Mean Plasma Glucose 117 (calc)   eAG (mmol/L) 6.5 (calc)  physical CBC  Result Value Ref Range   WBC 7.1 3.8 - 10.8 Thousand/uL   RBC 5.07 4.20 - 5.80 Million/uL   Hemoglobin 14.7 13.2 - 17.1 g/dL   HCT 43.6 38.5 - 50.0 %   MCV 86.0 80.0 - 100.0 fL   MCH 29.0 27.0 - 33.0 pg   MCHC 33.7 32.0 - 36.0 g/dL   RDW 13.0 11.0 - 15.0 %   Platelets 150 140 - 400 Thousand/uL   MPV 11.9 7.5 - 12.5 fL   Neutro Abs 4,182 1,500 - 7,800 cells/uL   Lymphs Abs 1,917 850 - 3,900 cells/uL   Absolute Monocytes 753 200 - 950 cells/uL   Eosinophils Absolute 192 15 - 500 cells/uL   Basophils Absolute 57 0 - 200 cells/uL   Neutrophils Relative % 58.9 %   Total Lymphocyte 27.0 %   Monocytes Relative 10.6 %   Eosinophils Relative 2.7 %   Basophils Relative 0.8 %      Assessment & Plan:   Problem List Items Addressed This Visit     Mixed hyperlipidemia   Relevant Orders   Lipid panel   Elevated hemoglobin A1c   Relevant Orders  COMPLETE METABOLIC PANEL WITH GFR   CBC with Differential/Platelet   Hemoglobin A1c   Other Visit Diagnoses     Annual physical exam    -  Primary   Relevant Orders   COMPLETE METABOLIC PANEL WITH GFR   CBC with Differential/Platelet   PSA   Prostate cancer screening       Relevant Orders   PSA   Need for hepatitis C screening test       Relevant Orders   Hepatitis C antibody   Screening for colon cancer       Relevant Orders   Ambulatory referral to Gastroenterology       Updated Health Maintenance information Fasting labs ordered. Referral to GI for Colonoscopy Encouraged improvement to lifestyle with diet and exercise Goal of weight loss  R thigh cyst - suggestive of localized cyst without sign of infection. May consider Gen Surgery drainage or excision in future if recurrent.  Orders Placed This Encounter  Procedures   COMPLETE METABOLIC PANEL WITH GFR    Lipid panel    Order Specific Question:   Has the patient fasted?    Answer:   Yes   CBC with Differential/Platelet   Hemoglobin A1c   PSA   Hepatitis C antibody   Ambulatory referral to Gastroenterology    Referral Priority:   Routine    Referral Type:   Consultation    Referral Reason:   Specialty Services Required    Number of Visits Requested:   1     No orders of the defined types were placed in this encounter.    Follow up plan: Return in about 1 year (around 04/20/2022) for 1 year Annual Physical AM apt fasting lab after.  Nobie Putnam, [facility] 04/20/2021, 9:02 AM

## 2021-04-21 LAB — CBC WITH DIFFERENTIAL/PLATELET
Absolute Monocytes: 727 cells/uL (ref 200–950)
Basophils Absolute: 71 cells/uL (ref 0–200)
Basophils Relative: 0.9 %
Eosinophils Absolute: 103 cells/uL (ref 15–500)
Eosinophils Relative: 1.3 %
HCT: 44.6 % (ref 38.5–50.0)
Hemoglobin: 14.8 g/dL (ref 13.2–17.1)
Lymphs Abs: 1612 cells/uL (ref 850–3900)
MCH: 29.4 pg (ref 27.0–33.0)
MCHC: 33.2 g/dL (ref 32.0–36.0)
MCV: 88.5 fL (ref 80.0–100.0)
MPV: 12.3 fL (ref 7.5–12.5)
Monocytes Relative: 9.2 %
Neutro Abs: 5388 cells/uL (ref 1500–7800)
Neutrophils Relative %: 68.2 %
Platelets: 159 10*3/uL (ref 140–400)
RBC: 5.04 10*6/uL (ref 4.20–5.80)
RDW: 12.9 % (ref 11.0–15.0)
Total Lymphocyte: 20.4 %
WBC: 7.9 10*3/uL (ref 3.8–10.8)

## 2021-04-21 LAB — LIPID PANEL
Cholesterol: 201 mg/dL — ABNORMAL HIGH (ref <200)
HDL: 41 mg/dL (ref >=40)
LDL Cholesterol (Calc): 141 mg/dL (calc) — ABNORMAL HIGH
Non-HDL Cholesterol (Calc): 160 mg/dL (calc) — ABNORMAL HIGH (ref <130)
Total CHOL/HDL Ratio: 4.9 (calc) (ref <5.0)
Triglycerides: 89 mg/dL (ref <150)

## 2021-04-21 LAB — COMPLETE METABOLIC PANEL WITH GFR
AG Ratio: 1.4 (calc) (ref 1.0–2.5)
ALT: 37 U/L (ref 9–46)
AST: 21 U/L (ref 10–35)
Albumin: 4.6 g/dL (ref 3.6–5.1)
Alkaline phosphatase (APISO): 61 U/L (ref 35–144)
BUN: 15 mg/dL (ref 7–25)
CO2: 27 mmol/L (ref 20–32)
Calcium: 9.7 mg/dL (ref 8.6–10.3)
Chloride: 108 mmol/L (ref 98–110)
Creat: 1.12 mg/dL (ref 0.70–1.30)
Globulin: 3.2 g/dL (calc) (ref 1.9–3.7)
Glucose, Bld: 102 mg/dL — ABNORMAL HIGH (ref 65–99)
Potassium: 5 mmol/L (ref 3.5–5.3)
Sodium: 142 mmol/L (ref 135–146)
Total Bilirubin: 0.4 mg/dL (ref 0.2–1.2)
Total Protein: 7.8 g/dL (ref 6.1–8.1)
eGFR: 80 mL/min/{1.73_m2} (ref >=60)

## 2021-04-21 LAB — HEMOGLOBIN A1C
Hgb A1c MFr Bld: 5.7 % of total Hgb — ABNORMAL HIGH (ref <5.7)
Mean Plasma Glucose: 117 mg/dL
eAG (mmol/L): 6.5 mmol/L

## 2021-04-21 LAB — HEPATITIS C ANTIBODY
Hepatitis C Ab: NONREACTIVE
SIGNAL TO CUT-OFF: 0.04 (ref <1.00)

## 2021-04-21 LAB — PSA: PSA: 0.56 ng/mL (ref <=4.00)

## 2021-04-22 ENCOUNTER — Other Ambulatory Visit: Payer: Self-pay

## 2021-04-22 DIAGNOSIS — Z1211 Encounter for screening for malignant neoplasm of colon: Secondary | ICD-10-CM

## 2021-04-22 MED ORDER — NA SULFATE-K SULFATE-MG SULF 17.5-3.13-1.6 GM/177ML PO SOLN: 1.0000 | Freq: Once | ORAL | 0 refills | Status: AC

## 2021-04-22 NOTE — Progress Notes (Signed)
Gastroenterology Pre-Procedure Review  Request Date: 05/19/2021 Requesting Physician: Dr. Vicente Males  PATIENT REVIEW QUESTIONS: The patient responded to the following health history questions as indicated:    1. Are you having any GI issues? no 2. Do you have a personal history of Polyps? no 3. Do you have a family history of Colon Cancer or Polyps? no 4. Diabetes Mellitus? no 5. Joint replacements in the past 12 months?no 6. Major health problems in the past 3 months?no 7. Any artificial heart valves, MVP, or defibrillator?no    MEDICATIONS & ALLERGIES:    Patient reports the following regarding taking any anticoagulation/antiplatelet therapy:   Plavix, Coumadin, Eliquis, Xarelto, Lovenox, Pradaxa, Brilinta, or Effient? no Aspirin? no  Patient confirms/reports the following medications:  No current outpatient medications on file.   No current facility-administered medications for this visit.    Patient confirms/reports the following allergies:  No Known Allergies  No orders of the defined types were placed in this encounter.   AUTHORIZATION INFORMATION Primary Insurance: 1D#: Group #:  Secondary Insurance: 1D#: Group #:  SCHEDULE INFORMATION: Date: 05/19/2021 Time: Location: [place]

## 2021-05-18 ENCOUNTER — Encounter: Payer: Self-pay | Admitting: Gastroenterology

## 2021-05-19 ENCOUNTER — Ambulatory Visit: Payer: 59 | Admitting: Anesthesiology

## 2021-05-19 ENCOUNTER — Encounter
Admission: RE | Disposition: A | Discharge status: Home / Self Care | Payer: Self-pay | Source: Home / Self Care | Attending: Gastroenterology

## 2021-05-19 ENCOUNTER — Ambulatory Visit
Admission: RE | Admit: 2021-05-19 | Discharge: 2021-05-19 | Disposition: A | Discharge status: Home / Self Care | Payer: 59 | Attending: Gastroenterology | Admitting: Gastroenterology

## 2021-05-19 DIAGNOSIS — Z1211 Encounter for screening for malignant neoplasm of colon: Secondary | ICD-10-CM | POA: Diagnosis not present

## 2021-05-19 DIAGNOSIS — D124 Benign neoplasm of descending colon: Secondary | ICD-10-CM | POA: Insufficient documentation

## 2021-05-19 DIAGNOSIS — K640 First degree hemorrhoids: Secondary | ICD-10-CM | POA: Diagnosis not present

## 2021-05-19 DIAGNOSIS — D125 Benign neoplasm of sigmoid colon: Secondary | ICD-10-CM | POA: Insufficient documentation

## 2021-05-19 DIAGNOSIS — K635 Polyp of colon: Secondary | ICD-10-CM | POA: Diagnosis not present

## 2021-05-19 DIAGNOSIS — K5730 Diverticulosis of large intestine without perforation or abscess without bleeding: Secondary | ICD-10-CM | POA: Diagnosis not present

## 2021-05-19 DIAGNOSIS — Z87891 Personal history of nicotine dependence: Secondary | ICD-10-CM | POA: Insufficient documentation

## 2021-05-19 HISTORY — PX: COLONOSCOPY WITH PROPOFOL: SHX5780

## 2021-05-19 SURGERY — COLONOSCOPY WITH PROPOFOL
Anesthesia: General

## 2021-05-19 MED ORDER — DEXMEDETOMIDINE (PRECEDEX) IN NS 20 MCG/5ML (4 MCG/ML) IV SYRINGE
PREFILLED_SYRINGE | INTRAVENOUS | Status: DC | PRN
  Administered 2021-05-19: 12 ug via INTRAVENOUS

## 2021-05-19 MED ORDER — PROPOFOL 500 MG/50ML IV EMUL
INTRAVENOUS | Status: DC | PRN
  Administered 2021-05-19: 140 ug/kg/min via INTRAVENOUS

## 2021-05-19 MED ORDER — PHENYLEPHRINE HCL (PRESSORS) 10 MG/ML IV SOLN
INTRAVENOUS | Status: DC | PRN
Start: 2021-05-19 — End: 2021-05-19
  Administered 2021-05-19: 5 ug via INTRAVENOUS

## 2021-05-19 MED ORDER — SODIUM CHLORIDE 0.9 % IV SOLN: INTRAVENOUS | Status: DC

## 2021-05-19 MED ORDER — LIDOCAINE HCL (CARDIAC) PF 100 MG/5ML IV SOSY
PREFILLED_SYRINGE | INTRAVENOUS | Status: DC | PRN
  Administered 2021-05-19: 80 mg via INTRAVENOUS

## 2021-05-19 MED ORDER — PROPOFOL 10 MG/ML IV BOLUS
INTRAVENOUS | Status: DC | PRN
  Administered 2021-05-19: 80 mg via INTRAVENOUS

## 2021-05-19 NOTE — Anesthesia Preprocedure Evaluation (Addendum)
Anesthesia Evaluation  ?Patient identified by MRN, date of birth, ID band ?Patient awake ? ? ? ?Reviewed: ?Allergy & Precautions, NPO status , Patient's Chart, lab work & pertinent test results ? ?Airway ?Mallampati: III ? ?TM Distance: >3 FB ?Neck ROM: full ? ? ? Dental ?no notable dental hx. ? ?  ?Pulmonary ?neg pulmonary ROS, former smoker,  ?  ?Pulmonary exam normal ? ? ? ? ? ? ? Cardiovascular ?negative cardio ROS ?Normal cardiovascular exam ? ? ?  ?Neuro/Psych ?negative neurological ROS ? negative psych ROS  ? GI/Hepatic ?negative GI ROS, (+)  ?  ? substance abuse ? marijuana use,   ?Endo/Other  ?negative endocrine ROS ? Renal/GU ?negative Renal ROS  ?negative genitourinary ?  ?Musculoskeletal ? ? Abdominal ?Normal abdominal exam  (+)   ?Peds ? Hematology ?negative hematology ROS ?(+)   ?Anesthesia Other Findings ?Past Medical History: ?No date: Genital herpes ?No date: Hemorrhoid ? ?Past Surgical History: ?No date: FRACTURE SURGERY ?No date: ORIF PROXIMAL TIBIAL PLATEAU FRACTURE ?No date: WISDOM TOOTH EXTRACTION ? ? ? ? Reproductive/Obstetrics ?negative OB ROS ? ?  ? ? ? ? ? ? ? ? ? ? ? ? ? ?  ?  ? ? ? ? ? ? ? ?Anesthesia Physical ?Anesthesia Plan ? ?ASA: 2 ? ?Anesthesia Plan: General  ? ?Post-op Pain Management:   ? ?Induction: Intravenous ? ?PONV Risk Score and Plan: Propofol infusion and TIVA ? ?Airway Management Planned: Natural Airway ? ?Additional Equipment:  ? ?Intra-op Plan:  ? ?Post-operative Plan:  ? ?Informed Consent: I have reviewed the patients History and Physical, chart, labs and discussed the procedure including the risks, benefits and alternatives for the proposed anesthesia with the patient or authorized representative who has indicated his/her understanding and acceptance.  ? ? ? ?Dental Advisory Given ? ?Plan Discussed with: Anesthesiologist, CRNA and Surgeon ? ?Anesthesia Plan Comments:   ? ? ? ? ? ? ?Anesthesia Quick Evaluation ? ?

## 2021-05-19 NOTE — H&P (Signed)
? ? ? ?Jonathon Bellows, MD ?[address] [place], [place], [place], [address] ?[address] [place], [place], [place], [address] ?Phone: [phone]  ?Fax: [phone] ? ?Primary Care Physician:  Olin Hauser, DO ? ? ?Pre-Procedure History & Physical: ?HPI:  Kenneth Young is a 51 y.o. male is here for an colonoscopy. ?  ?Past Medical History:  ?Diagnosis Date  ? Genital herpes   ? Hemorrhoid   ? ? ?Past Surgical History:  ?Procedure Laterality Date  ? FRACTURE SURGERY    ? ORIF PROXIMAL TIBIAL PLATEAU FRACTURE    ? WISDOM TOOTH EXTRACTION    ? ? ?Prior to Admission medications   ?Not on File  ? ? ?Allergies as of 04/22/2021  ? (No Known Allergies)  ? ? ?Family History  ?Problem Relation Age of Onset  ? Cataracts Mother   ? Osteoarthritis Mother   ?     Bilateral Hip Replacement Surgery  ? Hypertension Brother   ? Prostate cancer Neg Hx   ? Colon cancer Neg Hx   ? ? ?Social History  ? ?Socioeconomic History  ? Marital status: Married  ?  Spouse name: Not on file  ? Number of children: 1  ? Years of education: College  ? Highest education level: Not on file  ?Occupational History  ? Occupation: Aeronautical engineer  ?  Comment: Computers, Has degree in Classical Greek/[employer]  ?Tobacco Use  ? Smoking status: Former  ?  Packs/day: 1.00  ?  Years: 10.00  ?  Pack years: 10.00  ?  Types: Cigarettes  ?  Quit date: 03/01/1992  ?  Years since quitting: 29.2  ? Smokeless tobacco: Former  ? Tobacco comments:  ?  Former history smoked for 10 years, then quit  ?Vaping Use  ? Vaping Use: Former  ? Start date: 03/01/2005  ? Quit date: 12/31/2015  ?Substance and Sexual Activity  ? Alcohol use: Yes  ?  Alcohol/week: 2.0 standard drinks  ?  Types: 2 Cans of beer per week  ?  Comment: Limited to beer only  ? Drug use: Yes  ?  Types: Marijuana  ?  Comment: 3x weekly, recreational / relaxation  ? Sexual activity: Not on file  ?Other Topics Concern  ? Not on file  ?Social History Narrative  ? Not on file  ? ?Social Determinants  of Health  ? ?Financial Resource Strain: Not on file  ?Food Insecurity: Not on file  ?Transportation Needs: Not on file  ?Physical Activity: Not on file  ?Stress: Not on file  ?Social Connections: Not on file  ?Intimate Partner Violence: Not on file  ? ? ?Review of Systems: ?See HPI, otherwise negative ROS ? ?Physical Exam: ?BP 135/90   Pulse 69   Temp 97.6 ?F (36.4 ?C)   Resp 18   Ht '6\' 1"'$  (1.854 m)   Wt 102.1 kg   SpO2 97%   BMI 29.69 kg/m?  ?General:   Alert,  pleasant and cooperative in NAD ?Head:  Normocephalic and atraumatic. ?Neck:  Supple; no masses or thyromegaly. ?Lungs:  Clear throughout to auscultation, normal respiratory effort.    ?Heart:  +S1, +S2, Regular rate and rhythm, No edema. ?Abdomen:  Soft, nontender and nondistended. Normal bowel sounds, without guarding, and without rebound.   ?Neurologic:  Alert and  oriented x4;  grossly normal neurologically. ? ?Impression/Plan: ?Kenneth Young is here for an colonoscopy to be performed for Screening colonoscopy average risk   ?Risks, benefits, limitations, and alternatives regarding  colonoscopy have been reviewed with the patient.  Questions have been answered.  All parties agreeable. ? ? ?Jonathon Bellows, MD  05/19/2021, 9:22 AM ? ?

## 2021-05-19 NOTE — Op Note (Signed)
[facility] ?Gastroenterology ?Patient Name: Kenneth Young ?Procedure Date: 05/19/2021 9:15 AM ?MRN: [id number] ?Account #: [id number] ?Date of Birth: 06-04-1970 ?Admit Type: Outpatient ?Age: 51 ?Room: [facility] ENDO ROOM 2 ?Gender: Male ?Note Status: Finalized ?Instrument Name: Colonoscope [id number] ?Procedure:             Colonoscopy ?Indications:           Screening for colorectal malignant neoplasm ?Providers:             Jonathon Bellows MD, MD ?Referring MD:          Olin Hauser (Referring MD) ?Medicines:             Monitored Anesthesia Care ?Complications:         No immediate complications. ?Procedure:             Pre-Anesthesia Assessment: ?                       - Prior to the procedure, a History and Physical was  ?                       performed, and patient medications, allergies and  ?                       sensitivities were reviewed. The patient's tolerance  ?                       of previous anesthesia was reviewed. ?                       - The risks and benefits of the procedure and the  ?                       sedation options and risks were discussed with the  ?                       patient. All questions were answered and informed  ?                       consent was obtained. ?                       - ASA Grade Assessment: II - A patient with mild  ?                       systemic disease. ?                       After obtaining informed consent, the colonoscope was  ?                       passed under direct vision. Throughout the procedure,  ?                       the patient's blood pressure, pulse, and oxygen  ?                       saturations were monitored continuously. The  ?                       Colonoscope was introduced  through the anus and  ?                       advanced to the the cecum, identified by the  ?                       appendiceal orifice. The colonoscopy was performed  ?                       with ease. The patient tolerated the procedure  well.  ?                       The quality of the bowel preparation was excellent. ?Findings: ?     Multiple small-mouthed diverticula were found in the sigmoid colon. ?     Non-bleeding internal hemorrhoids were found during retroflexion. The  ?     hemorrhoids were large and Grade I (internal hemorrhoids that do not  ?     prolapse). ?     A 5 mm polyp was found in the descending colon. The polyp was sessile.  ?     The polyp was removed with a jumbo cold forceps. Resection and retrieval  ?     were complete. ?     A 10 mm polyp was found in the sigmoid colon. The polyp was  ?     semi-pedunculated. The polyp was removed with a hot snare. Resection and  ?     retrieval were complete. ?     The exam was otherwise without abnormality on direct and retroflexion  ?     views. ?Impression:            - Diverticulosis in the sigmoid colon. ?                       - Non-bleeding internal hemorrhoids. ?                       - One 5 mm polyp in the descending colon, removed with  ?                       a jumbo cold forceps. Resected and retrieved. ?                       - One 10 mm polyp in the sigmoid colon, removed with a  ?                       hot snare. Resected and retrieved. ?                       - The examination was otherwise normal on direct and  ?                       retroflexion views. ?Recommendation:        - Discharge patient to home (with escort). ?                       - Resume previous diet. ?                       - Continue present medications. ?                       -  Await pathology results. ?                       - Repeat colonoscopy in 3 years for surveillance based  ?                       on pathology results. ?Procedure Code(s):     --- Professional --- ?                       [phone], Colonoscopy, flexible; with removal of  ?                       tumor(s), polyp(s), or other lesion(s) by snare  ?                       technique ?                       45380, 59, Colonoscopy, flexible;  with biopsy, single  ?                       or multiple ?Diagnosis Code(s):     --- Professional --- ?                       Z12.11, Encounter for screening for malignant neoplasm  ?                       of colon ?                       K64.0, First degree hemorrhoids ?                       K63.5, Polyp of colon ?                       K57.30, Diverticulosis of large intestine without  ?                       perforation or abscess without bleeding ?CPT copyright 2019 American Medical Association. All rights reserved. ?The codes documented in this report are preliminary and upon coder review may  ?be revised to meet current compliance requirements. ?Jonathon Bellows, MD ?Jonathon Bellows MD, MD ?05/19/2021 9:46:39 AM ?This report has been signed electronically. ?Number of Addenda: 0 ?Note Initiated On: 05/19/2021 9:15 AM ?Scope Withdrawal Time: 0 hours 13 minutes 26 seconds  ?Total Procedure Duration: 0 hours 16 minutes 9 seconds  ?Estimated Blood Loss:  Estimated blood loss: none. ?     [facility] ?

## 2021-05-19 NOTE — Anesthesia Postprocedure Evaluation (Signed)
Anesthesia Post Note ? ?Patient: Kenneth Young ? ?Procedure(s) Performed: COLONOSCOPY WITH PROPOFOL ? ?Patient location during evaluation: Endoscopy ?Anesthesia Type: General ?Level of consciousness: awake and alert ?Pain management: pain level controlled ?Vital Signs Assessment: post-procedure vital signs reviewed and stable ?Respiratory status: spontaneous breathing, nonlabored ventilation and respiratory function stable ?Cardiovascular status: blood pressure returned to baseline and stable ?Postop Assessment: no apparent nausea or vomiting ?Anesthetic complications: no ? ? ?No notable events documented. ? ? ?Last Vitals:  ?Vitals:  ? 05/19/21 1007 05/19/21 1017  ?BP: (!) 133/93 (!) 131/96  ?Pulse: 68 67  ?Resp: 16 17  ?Temp:    ?SpO2: 98% 100%  ?  ?Last Pain:  ?Vitals:  ? 05/19/21 1017  ?TempSrc:   ?PainSc: 0-No pain  ? ? ?  ?  ?  ?  ?  ?  ? ?[place] Ouch ? ? ? ? ?

## 2021-05-19 NOTE — Transfer of Care (Signed)
Immediate Anesthesia Transfer of Care Note ? ?Patient: Kenneth Young ? ?Procedure(s) Performed: COLONOSCOPY WITH PROPOFOL ? ?Patient Location: PACU ? ?Anesthesia Type:General ? ?Level of Consciousness: awake, alert  and oriented ? ?Airway & Oxygen Therapy: Patient Spontanous Breathing ? ?Post-op Assessment: Report given to RN and Post -op Vital signs reviewed and stable ? ?Post vital signs: Reviewed and stable ? ?Last Vitals:  ?Vitals Value Taken Time  ?BP 96/67 05/19/21 0947  ?Temp 35.7 ?C 05/19/21 0947  ?Pulse 67 05/19/21 0947  ?Resp 15 05/19/21 0947  ?SpO2 96 % 05/19/21 0947  ? ? ?Last Pain:  ?Vitals:  ? 05/19/21 0947  ?TempSrc: Temporal  ?PainSc: Asleep  ?   ? ?  ? ?Complications: No notable events documented. ?

## 2021-05-20 ENCOUNTER — Encounter: Payer: Self-pay | Admitting: Gastroenterology

## 2021-05-20 LAB — SURGICAL PATHOLOGY

## 2021-05-21 ENCOUNTER — Encounter: Payer: Self-pay | Admitting: Gastroenterology

## 2022-06-18 ENCOUNTER — Ambulatory Visit: Payer: 59 | Admitting: Internal Medicine

## 2022-06-18 ENCOUNTER — Encounter: Payer: Self-pay | Admitting: Internal Medicine

## 2022-06-18 VITALS — BP 147/81 | HR 66 | Temp 98.3°F | Resp 18 | Ht 73.0 in | Wt 234.6 lb

## 2022-06-18 DIAGNOSIS — R109 Unspecified abdominal pain: Secondary | ICD-10-CM

## 2022-06-18 DIAGNOSIS — B029 Zoster without complications: Secondary | ICD-10-CM

## 2022-06-18 DIAGNOSIS — U071 COVID-19: Secondary | ICD-10-CM

## 2022-06-18 NOTE — Progress Notes (Signed)
Subjective:    Patient ID: Kenneth Young, male    DOB: February 19, 1971, 52 y.o.   MRN: [id number]  HPI  Patient presents to clinic today with complaint right side pain This started 1 week ago.  He describes the pain as a burning sensation on the inside. He did have shingles 2 weeks ago just above this area. He did not get any treatment for the shingles. He was diagnosed with Covid 1 week ago, but did not get any treatment for this. He did have a cough but denies chest tightness or shortness of breath. He denies any issues with his bowel or bladder. He has taken Ibuprofen OTC with some relief of symptoms.  Review of Systems     Past Medical History:  Diagnosis Date   Genital herpes    Hemorrhoid     No current outpatient medications on file.   No current facility-administered medications for this visit.    No Known Allergies  Family History  Problem Relation Age of Onset   Cataracts Mother    Osteoarthritis Mother        Bilateral Hip Replacement Surgery   Hypertension Brother    Prostate cancer Neg Hx    Colon cancer Neg Hx     Social History   Socioeconomic History   Marital status: Married    Spouse name: Not on file   Number of children: 1   Years of education: College   Highest education level: Not on file  Occupational History   Occupation: Geneticist, molecular    Comment: Computers, Has degree in Classical Greek/Latin  Tobacco Use   Smoking status: Former    Packs/day: 1.00    Years: 10.00    Additional pack years: 0.00    Total pack years: 10.00    Types: Cigarettes    Quit date: 03/01/1992    Years since quitting: 30.3   Smokeless tobacco: Former   Tobacco comments:    Former history smoked for 10 years, then quit  Vaping Use   Vaping Use: Former   Start date: 03/01/2005   Quit date: 12/31/2015  Substance and Sexual Activity   Alcohol use: Yes    Alcohol/week: 2.0 standard drinks of alcohol    Types: 2 Cans of beer per week    Comment: Limited to beer  only   Drug use: Yes    Types: Marijuana    Comment: 3x weekly, recreational / relaxation   Sexual activity: Not on file  Other Topics Concern   Not on file  Social History Narrative   Not on file   Social Determinants of Health   Financial Resource Strain: Not on file  Food Insecurity: Not on file  Transportation Needs: Not on file  Physical Activity: Not on file  Stress: Not on file  Social Connections: Not on file  Intimate Partner Violence: Not on file     Constitutional: Denies fever, malaise, fatigue, headache or abrupt weight changes.  HEENT: Denies eye pain, eye redness, ear pain, ringing in the ears, wax buildup, runny nose, nasal congestion, bloody nose, or sore throat. Respiratory: Denies difficulty breathing, shortness of breath, cough or sputum production.   Cardiovascular: Denies chest pain, chest tightness, palpitations or swelling in the hands or feet.  Gastrointestinal: Denies abdominal pain, bloating, constipation, diarrhea or blood in the stool.  GU: Denies urgency, frequency, pain with urination, burning sensation, blood in urine, odor or discharge. Musculoskeletal: Patient reports right side pain.  Denies decrease in range of  motion, difficulty with gait, or joint swelling.  Skin: Patient reports resolving rash to right side of ribs.  Denies ulcercations.    No other specific complaints in a complete review of systems (except as listed in HPI above).  Objective:   Physical Exam  BP (!) 147/81 (BP Location: Right Arm, Patient Position: Sitting, Cuff Size: Large)   Pulse 66   Temp 98.3 F (36.8 C) (Oral)   Resp 18   Ht  (1.854 m)   Wt 234 lb 9.6 oz (106.4 kg)   BMI 30.95 kg/m   Wt Readings from Last 3 Encounters:  05/19/21 225 lb (102.1 kg)  04/20/21 231 lb 6.4 oz (105 kg)  09/02/20 242 lb 3.2 oz (109.9 kg)    General: Appears his stated age, obese, in NAD. Skin: Warm, dry and intact.  Hyperpigmented area noted to right side of ribs in a  dermatomal pattern, healing. HEENT: Head: normal shape and size; Eyes: sclera white, no icterus, conjunctiva pink, PERRLA and EOMs intact;  Cardiovascular: Normal rate and rhythm. S1,S2 noted.  No murmur, rubs or gallops noted. No JVD or BLE edema.  Pulmonary/Chest: Normal effort and positive vesicular breath sounds. No respiratory distress. No wheezes, rales or ronchi noted.  Abdomen: Soft and nontender. Normal bowel sounds.  Musculoskeletal: No pain with palpation over the right lateral ribs.  No difficulty with gait.  Neurological: Alert and oriented.    BMET    Component Value Date/Time   NA 142 04/20/2021 0936   K 5.0 04/20/2021 0936   CL 108 04/20/2021 0936   CO2 27 04/20/2021 0936   GLUCOSE 102 (H) 04/20/2021 0936   BUN 15 04/20/2021 0936   CREATININE 1.12 04/20/2021 0936   CALCIUM 9.7 04/20/2021 0936   GFRNONAA 84 03/05/2019 0859   GFRAA 97 03/05/2019 0859    Lipid Panel     Component Value Date/Time   CHOL 201 (H) 04/20/2021 0936   TRIG 89 04/20/2021 0936   HDL 41 04/20/2021 0936   CHOLHDL 4.9 04/20/2021 0936   LDLCALC 141 (H) 04/20/2021 0936    CBC    Component Value Date/Time   WBC 7.9 04/20/2021 0936   RBC 5.04 04/20/2021 0936   HGB 14.8 04/20/2021 0936   HCT 44.6 04/20/2021 0936   PLT 159 04/20/2021 0936   MCV 88.5 04/20/2021 0936   MCH 29.4 04/20/2021 0936   MCHC 33.2 04/20/2021 0936   RDW 12.9 04/20/2021 0936   LYMPHSABS 1,612 04/20/2021 0936   EOSABS 103 04/20/2021 0936   BASOSABS 71 04/20/2021 0936    Hgb A1C Lab Results  Component Value Date   HGBA1C 5.7 (H) 04/20/2021            Assessment & Plan:   Right Side Pain:  Differentials include costochondritis, pneumonia secondary to COVID, neuropathic pain secondary to shingles His lung exam is benign and he has no bony tenderness over the ribs or in the intercostal spaces I think his pain is residual from shingles It is improving Discussed giving him an Rx for gabapentin 100 mg 3  times daily as needed to take over the weekend if needed but he does not feel like he needs that at this time Will monitor  Follow-up with your PCP as previously scheduled Nicki Reaper, NP

## 2022-06-18 NOTE — Patient Instructions (Signed)

## 2023-01-01 IMAGING — DX DG FINGER THUMB 2+V*R*
3 series · 3 of 3 positions shown · non-contrast
Comparison: None.

CLINICAL DATA: Thumb pain

EXAM:
RIGHT THUMB 2+V

[finger ap (1 of 2)]
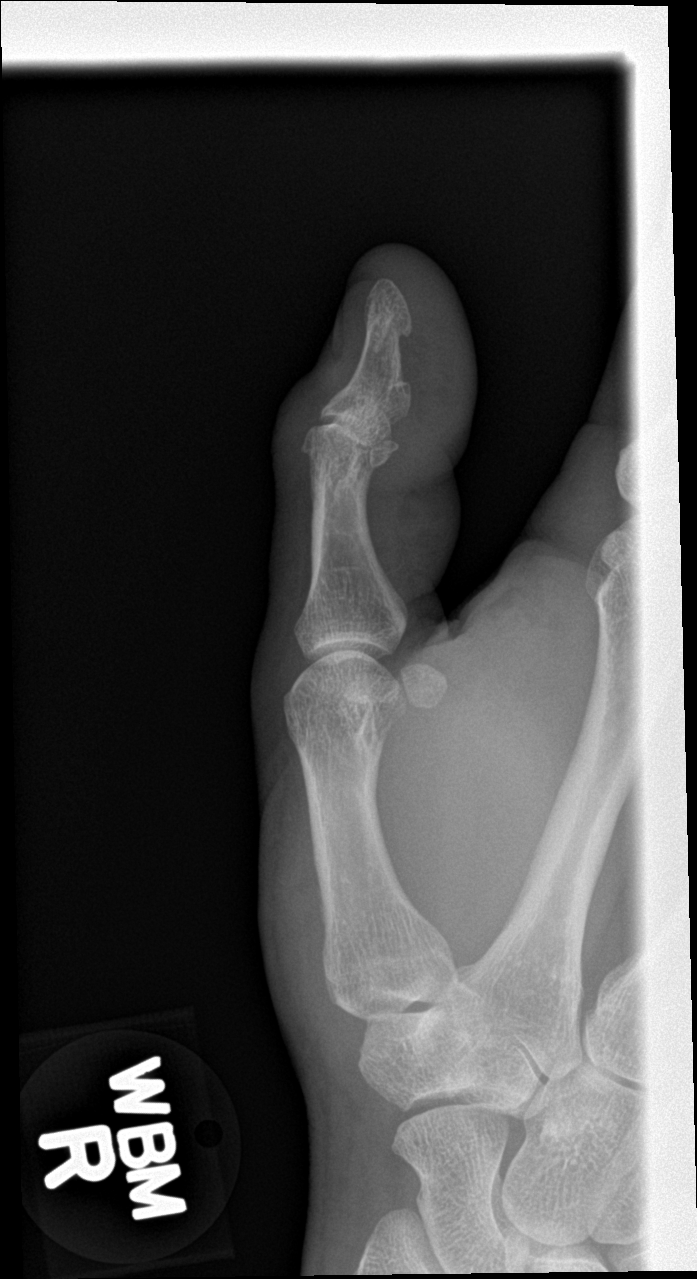

[finger lat]
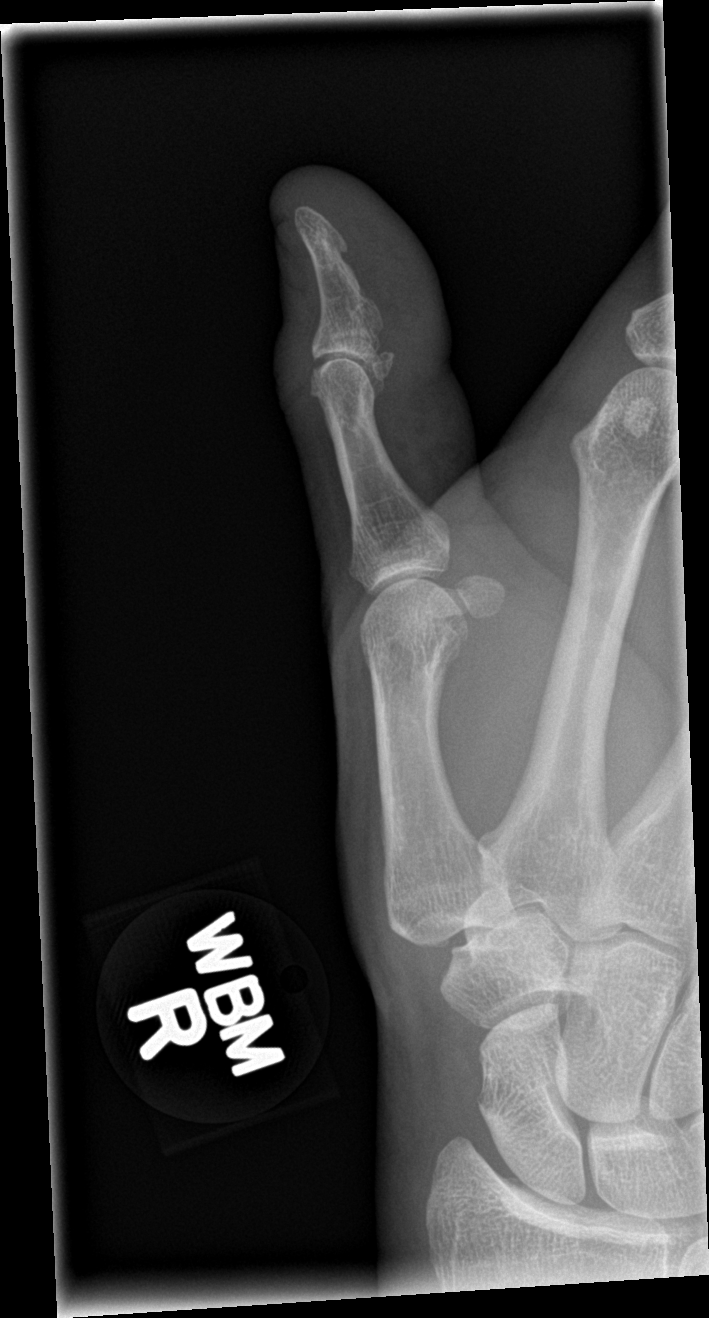

[finger ap (2 of 2)]
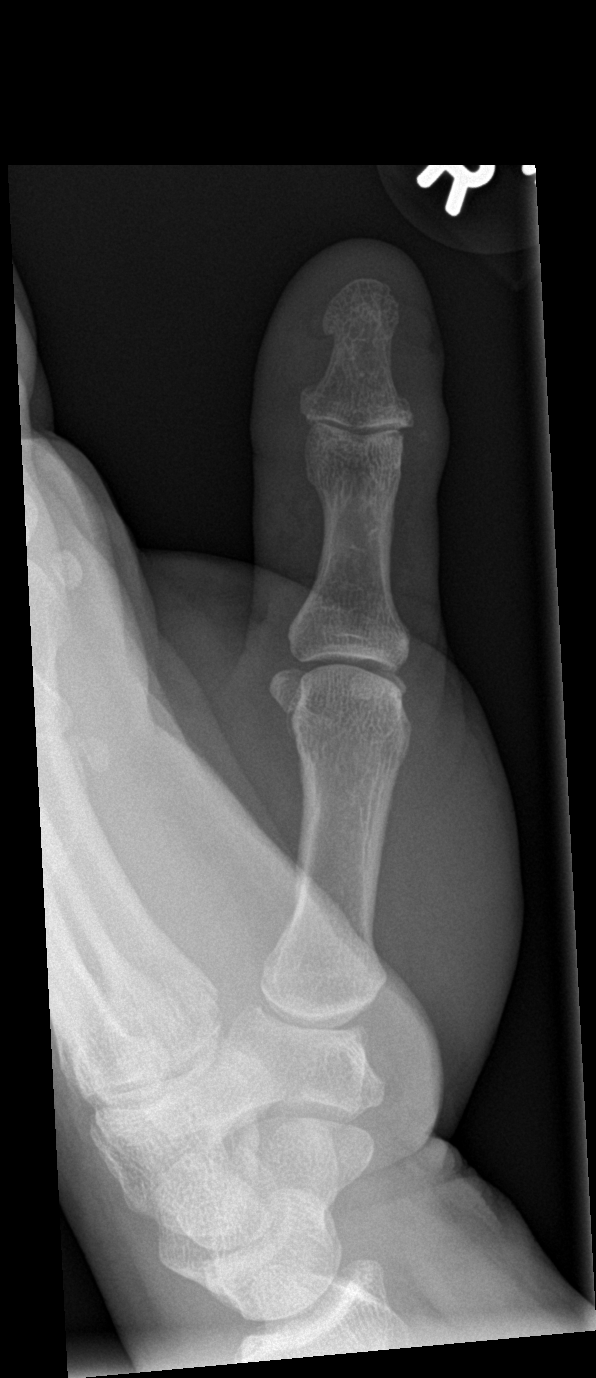

[3 of 3 positions shown; findings below may reference images not displayed]

FINDINGS: No fracture or malalignment. Minimal degenerative spurring at the IP
joint.
IMPRESSION: Minimal degenerative change at the IP joint.

## 2023-04-25 ENCOUNTER — Ambulatory Visit: Payer: 59 | Admitting: Internal Medicine

## 2023-04-25 ENCOUNTER — Encounter: Payer: Self-pay | Admitting: Internal Medicine

## 2023-04-25 VITALS — BP 130/84 | Ht 73.0 in | Wt 238.4 lb

## 2023-04-25 DIAGNOSIS — L301 Dyshidrosis [pompholyx]: Secondary | ICD-10-CM | POA: Diagnosis not present

## 2023-04-25 MED ORDER — MOMETASONE FUROATE 0.1 % EX OINT
TOPICAL_OINTMENT | Freq: Every day | CUTANEOUS | 0 refills | Status: AC
Start: 2023-04-25 — End: ?

## 2023-04-25 MED ORDER — PREDNISONE 10 MG PO TABS: ORAL_TABLET | ORAL | 0 refills | Status: DC

## 2023-04-25 NOTE — Progress Notes (Signed)
 Subjective:    Patient ID: Kenneth Young, male    DOB: [birth year], 53 y.o.   MRN: [id number]  HPI  Discussed the use of AI scribe software for clinical note transcription with the patient, who gave verbal consent to proceed.   Kenneth Young is a 53 year old male who presents with blisters on his hands for one year.  He has had tiny blisters on his hands for the past year, primarily located on the palm side and inside of his fingers, occasionally appearing on the right hand. The blisters sometimes rise to the surface and tend to dry up and disappear within a day or two.  The blisters can be itchy, especially when located on a joint. No other skin problems such as psoriasis. He has not tried any over-the-counter treatments specifically for the blisters.  He washes his hands frequently, partly due to having a dog and two cats, and uses a lot of hot water, which could exacerbate the condition.  He uses alcohol and hand sanitizer to dry out the blisters, which sometimes causes burning due to skin dryness and minor lacerations. No diabetes and no medication allergies.        Review of Systems   Past Medical History:  Diagnosis Date   Genital herpes    Hemorrhoid     No current outpatient medications on file.   No current facility-administered medications for this visit.    No Known Allergies  Family History  Problem Relation Age of Onset   Cataracts Mother    Osteoarthritis Mother        Bilateral Hip Replacement Surgery   Hypertension Brother    Prostate cancer Neg Hx    Colon cancer Neg Hx     Social History   Socioeconomic History   Marital status: Married    Spouse name: Not on file   Number of children: 1   Years of education: College   Highest education level: Not on file  Occupational History   Occupation: Geneticist, molecular    Comment: Computers, Has degree in Classical Greek/[employer]  Tobacco Use   Smoking status: Former    Current packs/day: 0.00     Average packs/day: 1 pack/day for 10.0 years (10.0 ttl pk-yrs)    Types: Cigarettes    Start date: 03/01/1982    Quit date: 03/01/1992    Years since quitting: 31.1   Smokeless tobacco: Former   Tobacco comments:    Former history smoked for 10 years, then quit  Vaping Use   Vaping status: Former   Start date: 03/01/2005   Quit date: 12/31/2015  Substance and Sexual Activity   Alcohol use: Yes    Alcohol/week: 2.0 standard drinks of alcohol    Types: 2 Cans of beer per week    Comment: Limited to beer only   Drug use: Yes    Types: Marijuana    Comment: 3x weekly, recreational / relaxation   Sexual activity: Not on file  Other Topics Concern   Not on file  Social History Narrative   Not on file   Social Drivers of Health   Financial Resource Strain: Not on file  Food Insecurity: Not on file  Transportation Needs: Not on file  Physical Activity: Not on file  Stress: Not on file  Social Connections: Not on file  Intimate Partner Violence: Not on file     Constitutional: Denies fever, malaise, fatigue, headache or abrupt weight changes.  Respiratory: Denies difficulty breathing, shortness of  breath, cough or sputum production.   Cardiovascular: Denies chest pain, chest tightness, palpitations or swelling in the hands or feet.  Skin: Patient reports blisters of hand.  Denies redness or ulcercations.   No other specific complaints in a complete review of systems (except as listed in HPI above).      Objective:   Physical Exam  BP 130/84   Ht 6\' 1"  (1.854 m)   Wt 238 lb 6.4 oz (108.1 kg)   BMI 31.45 kg/m   Wt Readings from Last 3 Encounters:  06/18/22 234 lb 9.6 oz (106.4 kg)  05/19/21 225 lb (102.1 kg)  04/20/21 231 lb 6.4 oz (105 kg)    General: Appears his stated age, obese, in NAD. Skin: Dyshidrotic eczema noted of the digits and interphalangeal webs bilaterally, L >R.  Cardiovascular: Normal rate. Pulmonary/Chest: Normal effort. Neurological: Alert and  oriented.   BMET    Component Value Date/Time   NA 142 04/20/2021 0936   K 5.0 04/20/2021 0936   CL 108 04/20/2021 0936   CO2 27 04/20/2021 0936   GLUCOSE 102 (H) 04/20/2021 0936   BUN 15 04/20/2021 0936   CREATININE 1.12 04/20/2021 0936   CALCIUM 9.7 04/20/2021 0936   GFRNONAA 84 03/05/2019 0859   GFRAA 97 03/05/2019 0859    Lipid Panel     Component Value Date/Time   CHOL 201 (H) 04/20/2021 0936   TRIG 89 04/20/2021 0936   HDL 41 04/20/2021 0936   CHOLHDL 4.9 04/20/2021 0936   LDLCALC 141 (H) 04/20/2021 0936    CBC    Component Value Date/Time   WBC 7.9 04/20/2021 0936   RBC 5.04 04/20/2021 0936   HGB 14.8 04/20/2021 0936   HCT 44.6 04/20/2021 0936   PLT 159 04/20/2021 0936   MCV 88.5 04/20/2021 0936   MCH 29.4 04/20/2021 0936   MCHC 33.2 04/20/2021 0936   RDW 12.9 04/20/2021 0936   LYMPHSABS 1,612 04/20/2021 0936   EOSABS 103 04/20/2021 0936   BASOSABS 71 04/20/2021 0936    Hgb A1C Lab Results  Component Value Date   HGBA1C 5.7 (H) 04/20/2021            Assessment & Plan:   Assessment and Plan    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 Blisters appear and resolve within a few days. No other skin conditions reported. Likely exacerbated by frequent hand washing, use of hand sanitizers, and hot water. -Start Prednisone taper:30mg  for 3 days, 20mg  for 3 days, 10mg  for 3 days. Start in the morning to avoid sleep disturbances. -Apply Mometasone ointment twice daily to affected areas. Avoid touching face and pets after application. Consider applying at night and wearing gloves to sleep. -Avoid harsh chemicals and hot water on hands.       Follow-up with your PCP as previously scheduled Nicki Reaper, NP

## 2023-04-25 NOTE — Patient Instructions (Signed)
 Itchy Skin Blisters (Dyshidrotic Eczema): What to Know Dyshidrotic eczema, also called pompholyx, is a type of skin condition. It causes very itchy blisters on the hands and feet. It's more common before age 54, but it can affect people of any age. There's no cure, but treatments can help relieve symptoms. What are the causes? The cause of this condition isn't known. What increases the risk? You're more likely to get this condition if: You wash your hands a lot. You have a personal or family history of eczema, allergies, asthma, or hay fever. You have allergies to metals like nickel or cobalt. You work with skin irritants like detergents or cement. You smoke. You're often stressed. You have an immune system condition. What are the signs or symptoms? Symptoms may come and go and often affect your hands or feet. Symptoms include: Severe itching, often before blisters show up. Blisters that can form suddenly. At first, the blisters may form near the fingertips. In severe cases, blisters may grow to large blister masses. Blisters go away in 2-3 weeks. This is followed by a less itchy and dry phase. Pain and swelling. Cracks or long, narrow openings (fissures) in the skin. Severe dryness. Ridges on the nails. How is this diagnosed? This condition may be diagnosed based on: Symptoms. Physical exam. Medical history. Skin scrapings to rule out fungal infections. Testing a swab of fluid for bacteria. A biopsy. A small piece of skin is removed and checked for infection or to rule out other conditions. Skin patch tests that use allergen patches on your back to check for allergic reactions. You may need to see a skin specialist called a dermatologist. This specialist can help diagnose and treat this condition. How is this treated? There's no cure for this condition, but treatment can help relieve symptoms. Your health care provider may suggest: Avoiding allergens, irritants, or triggers that  make your symptoms worse. You may need to: Use different soaps or lotions. Avoid hot weather or places where you'll sweat a lot. Learn stress management techniques, like relaxation and exercise. Follow diet changes that your provider recommends. Soothing your skin by: Using a clean, damp towel on the affected area. Taking baths with a special salt called aluminum acetate. Medicines such as: Medicine to lessen itching (antihistamines). Medicine to put on your skin to lessen swelling and irritation (corticosteroid creams or ointments). Immunosuppressant medicines. These are prescribed in severe cases. Antibiotic medicine if there's a skin infection. Light therapy, also called phototherapy. This is where you put your affected skin under ultraviolet (UV) light to lessen itchiness and inflammation. Follow these instructions at home: Bathing and skin care  Wash your skin gently. After bathing or washing your hands, pat your skin dry. Avoid rubbing. Take off your jewelry before bathing. If your skin under the jewelry stays wet, blisters may form or get worse. Apply cool compresses as told by your provider. To do this: Soak a clean towel in cool water. Squeeze out the water until the towel is damp. Place the towel on the affected skin for 20 minutes, 2-3 times a day. Let the water partially air dry, and then put on lotion. Use mild soaps, cleaners, and lotions that don't contain dyes, perfumes, or irritants. Keep your skin hydrated. To do this: Take warm baths or showers. Avoid hot water. Put on lotion within 3 minutes of bathing to lock in moisture. Medicines Take or apply your medicines only as told. If you were given antibiotics, take or apply them as told.  Do not stop using them even if you start to feel better. General instructions Use skin creams or lotions as told. Avoid triggers and allergens that make your symptoms worse. Keep fingernails short to avoid scratching open the  skin. Use waterproof gloves to protect your hands when doing work that keeps your hands wet for a long time. Limit how often you wear socks or shoes. If you have to wear socks, wear cotton socks that absorb moisture. Choose loose-fitting shoes. Do not smoke, vape, or use nicotine or tobacco. Keep all follow-up visits to make sure your treatment plan is working. Contact a health care provider if: You have symptoms that don't go away. You have signs of infection, such as: Crusting, pus, or a bad smell. More redness, swelling, or pain. More warmth in the affected area. Your skin has red streaks that are painful. This information is not intended to replace advice given to you by your health care provider. Make sure you discuss any questions you have with your health care provider. Document Revised: 07/20/2022 Document Reviewed: 07/20/2022 Elsevier Patient Education  2024 [employer].

## 2023-05-03 ENCOUNTER — Telehealth: Payer: Self-pay

## 2023-05-03 NOTE — Telephone Encounter (Signed)
 Copied from CRM [id number]. Topic: Referral - Question >> May 03, 2023 12:57 PM Payton Doughty wrote: Reason for CRM: pt wants to know if you can refer him for a mental health assessment.  It is something his lawyer thinks is a good idea, its related to family court. [phone]

## 2023-05-03 NOTE — Telephone Encounter (Signed)
 Unfortunately I cannot submit this type of referral without evaluating the patient.  His last visit with me was Feb 2023.  I have not seen him for mental health issues before, so this is considered a new problem and I would need a lot more information based on the report here before I can submit this referral.  I see his he is scheduled to return to see me at end of March 2025 for a CPE. That technically isn't the best time to discuss referrals either. We can make it work if he is just looking for a referral and no other major concerns related to this.  If he wants to have more in depth discussion and referral etc - he should likely schedule a separate focused visit for the mental health questions / in person or virtual video before or after his CPE.  Thank you  Saralyn Pilar, DO [facility] 05/03/2023, 6:30 PM

## 2023-05-04 NOTE — Telephone Encounter (Signed)
 Appointment made to discuss referral, verbal understanding from patient this is a separate appointment then his physical

## 2023-05-06 ENCOUNTER — Ambulatory Visit: Admitting: Family Medicine

## 2023-05-06 ENCOUNTER — Encounter: Payer: Self-pay | Admitting: Family Medicine

## 2023-05-06 VITALS — BP 138/78 | HR 65 | Resp 16 | Ht 73.0 in | Wt 239.0 lb

## 2023-05-06 DIAGNOSIS — F430 Acute stress reaction: Secondary | ICD-10-CM

## 2023-05-06 NOTE — Progress Notes (Signed)
 Subjective:    Patient ID: Kenneth Young, male    DOB: October 15, 1970, 53 y.o.   MRN: [id number]  Kenneth Young is a 54 y.o. male presenting on 05/06/2023 for Follow-up (Mental Health Referral needed for court custody case. )   HPI  Discussed the use of AI scribe software for clinical note transcription with the patient, who gave verbal consent to proceed.  History of Present Illness    He is here due to current stressful situation with this long term custody case. He is advised by legal counsel to seek a "Mental Health Assessment" that can help provide medical history to the court regarding moving the custody case forward. He is hopeful that this will be beneficial to his case.  He has been involved in a custody case for fifteen years, which has included multiple hearings and court orders. He discusses specific details of this case with me today to share context, he discovered that his son was living in a hoarding situation with his mother, characterized by an unusable and unsanitary kitchen. A stressful incident occurred when his son, under the mother's instruction, recorded a phone conversation where he expressed frustration about the living conditions. This recording is being used against him in court. He has been dealing with Child Protective Services visits, but no changes have been made to the living situation.  He is seeking information about obtaining a mental health assessment, which has been suggested by the opposing lawyer in the custody case. He would like to know information on how to proceed w/ this.  He is not currently experiencing symptoms of depression, mania, mood swings, or anxiety that require treatment. He is not on any medication for mental health issues and does not feel the need for therapy at this time. His primary concern is to obtain a mental health assessment to demonstrate his mental stability in the context of the custody case.           05/06/2023    1:29 PM  06/18/2022    9:14 AM 09/02/2020    9:47 AM  Depression screen PHQ 2/9  Decreased Interest 0 0 0  Down, Depressed, Hopeless 1 1 0  PHQ - 2 Score 1 1 0  Altered sleeping  0 0  Tired, decreased energy  0 0  Change in appetite  0 0  Feeling bad or failure about yourself   1 0  Trouble concentrating  1 0  Moving slowly or fidgety/restless  0 0  Suicidal thoughts  0 0  PHQ-9 Score  3 0  Difficult doing work/chores Not difficult at all Somewhat difficult Not difficult at all       05/06/2023    9:12 AM 06/18/2022    9:15 AM  GAD 7 : Generalized Anxiety Score  Nervous, Anxious, on Edge 1 1  Control/stop worrying 0 1  Worry too much - different things 0 1  Trouble relaxing 0 1  Restless  1  Easily annoyed or irritable 1 1  Afraid - awful might happen 0 1  Total GAD 7 Score  7  Anxiety Difficulty  Not difficult at all    Social History   Tobacco Use   Smoking status: Former    Current packs/day: 0.00    Average packs/day: 1 pack/day for 10.0 years (10.0 ttl pk-yrs)    Types: Cigarettes    Start date: 03/01/1982    Quit date: 03/01/1992    Years since quitting: 31.2   Smokeless tobacco: Former  Tobacco comments:    Former history smoked for 10 years, then quit  Vaping Use   Vaping status: Former   Start date: 03/01/2005   Quit date: 12/31/2015  Substance Use Topics   Alcohol use: Yes    Alcohol/week: 2.0 standard drinks of alcohol    Types: 2 Cans of beer per week    Comment: Limited to beer only   Drug use: Yes    Types: Marijuana    Comment: 3x weekly, recreational / relaxation    Review of Systems Per HPI unless specifically indicated above     Objective:    BP 138/78 (BP Location: Left Arm, Cuff Size: Normal)   Pulse 65   Resp 16   Ht 6\' 1"  (1.854 m)   Wt 239 lb (108.4 kg)   SpO2 98%   BMI 31.53 kg/m   Wt Readings from Last 3 Encounters:  05/06/23 239 lb (108.4 kg)  04/25/23 238 lb 6.4 oz (108.1 kg)  06/18/22 234 lb 9.6 oz (106.4 kg)    Physical  Exam Vitals and nursing note reviewed.  Constitutional:      General: He is not in acute distress.    Appearance: Normal appearance. He is well-developed. He is not diaphoretic.     Comments: Well-appearing, comfortable, cooperative  HENT:     Head: Normocephalic and atraumatic.  Eyes:     General:        Right eye: No discharge.        Left eye: No discharge.     Conjunctiva/sclera: Conjunctivae normal.  Cardiovascular:     Rate and Rhythm: Normal rate.  Pulmonary:     Effort: Pulmonary effort is normal.  Skin:    General: Skin is warm and dry.     Findings: No erythema or rash.  Neurological:     Mental Status: He is alert and oriented to person, place, and time.  Psychiatric:        Mood and Affect: Mood normal.        Behavior: Behavior normal.        Thought Content: Thought content normal.     Comments: Well groomed, good eye contact, normal speech and thoughts       Results for orders placed or performed during the hospital encounter of 05/19/21  Surgical pathology   Collection Time: 05/19/21  9:37 AM  Result Value Ref Range   SURGICAL PATHOLOGY      SURGICAL PATHOLOGY CASE: [id number] PATIENT: Barron Alvine Surgical Pathology Report     Specimen Submitted: A. Colon polyp, descending; cbx B. Colon polyp, sigmoid; hot snare  Clinical History: Colon cancer screening Z12.11.  Colon polyps, diverticulosis, inflamed diverticulum    DIAGNOSIS: A. COLON POLYP, DESCENDING; COLD BIOPSY: - TUBULAR ADENOMA. - NEGATIVE FOR HIGH-GRADE DYSPLASIA AND MALIGNANCY.  B. COLON POLYP, SIGMOID; HOT SNARE: - TUBULAR ADENOMA. - CAUTERIZED POLYP BASE APPEARS FREE OF DYSPLASIA. - NEGATIVE FOR HIGH-GRADE DYSPLASIA AND MALIGNANCY.  GROSS DESCRIPTION: A. Labeled: Descending colon polyp cbx Received: Formalin Collection time: 9:37 AM on 05/19/2021 Placed into formalin time: 9:37 AM on 05/19/2021 Tissue fragment(s): 1 Size: 0.4 x 0.2 x 0.1 cm Description: Tan soft  tissue fragment Entirely submitted in 1 cassette.  B. Labeled: Sigmoid colon polyp hot snare Received: Formalin Collection time: 9:41 AM on 05/19/2021 Placed in to formalin time: 9:41 AM on 05/19/2021 Tissue fragment(s): 1 Size: 0.5 x 0.5 x 0.1 cm Description: Received is a pink soft tissue fragment.  The resection margin is  inked green, and the fragment is bisected Entirely submitted in 1 cassette.  CM 05/19/2021  Final Diagnosis performed by Katherine Mantle, MD.   Electronically signed 05/20/2021 3:31:05PM The electronic signature indicates that the named Attending Pathologist has evaluated the specimen Technical component performed at [facility], [address] [place], [place] [address] Lab: [phone] Dir: Jolene Schimke, MD, MMM  Professional component performed at [facility], [address] [place], [place], [place] [address] Lab: [phone] Dir: Beryle Quant, MD       Assessment & Plan:   Problem List Items Addressed This Visit   None Visit Diagnoses       Stress reaction    -  Primary       Discussion today about his current complex situation with ongoing custody case My role today is to facilitate him with information on obtaining a mental health assessment for legal documentation in a custody case. No significant symptoms of mental disorders present. Assessment is for legal purposes, not medical necessity, as it is elective. He has no significant mental health history or prior treatment that I am aware of  - Provide list of local and virtual mental health providers that he can use for this service I recommend pursuing Psychiatry evaluation instead of ongoing behavioral therapy  Reassurance provided that hopefully he is able to get a beneficial evaluation that would not require any further follow-up. He has acute stress right now with this situation, but no clear diagnosis of depression or anxiety or any other mental health condition.  - Advise  checking insurance for in-network providers to reduce costs. - Suggest confirming with providers about performing legal assessments. - Offer referral if needed, he will let me know location  Follow-up Follow-up appointment scheduled for general physical examination. - Plan fasting blood work during next appointment.         No orders of the defined types were placed in this encounter.   No orders of the defined types were placed in this encounter.   Follow up plan: Return if symptoms worsen or fail to improve.   Saralyn Pilar, DO [facility] 05/06/2023, 9:14 AM

## 2023-05-06 NOTE — Patient Instructions (Addendum)
Thank you for coming to the office today.  These offices have both PSYCHIATRY doctors and THERAPISTS  [facility] (Virtual Available) [place] [address] [place], [place] [address] Phone: [phone]  [facility] Address: [address], [place], [place] [address] [email or web address] Phone: [phone]  [facility]) Address: [address], [place], [place] [address] Hours: 8:30AM-5PM Phone: [phone]  [facility] (Adult, Peds, Geriatric, Counseling) [address] [place], [place] [address] Phone: [phone] Fax: [phone]  [facility] at [facility] [address] [place], [place] [address] Phone: [phone]  [facility] (All ages) [address] [place], [id number] Phone: [phone] (Option 1) [email or web address]  ----------------------------------------------------------------- THERAPIST ONLY  (No Psychiatry)  [facility] [address] [place], [place] [address] [place] P: [phone]  [facility]) [facility], [facility] [address] [place], [place] [address] [phone]  [facility]   Address: [address] [place], [place] [address] Hours: Open today  9AM-7PM Phone: [phone]  [facility] [address], [facility]  - [facility] Address: [address] [place], [place] [address] Phone: [phone]  [facility] [place], [place] [address] Phone: [phone]   Please schedule a Follow-up Appointment to: No follow-ups on file.  If you have any other questions or concerns, please feel free to call the office or send a message through MyChart. You may also schedule an earlier appointment if necessary.  Additionally, you may be  receiving a survey about your experience at our office within a few days to 1 week by e-mail or mail. We value your feedback.  Saralyn Pilar, DO [facility] [place]

## 2023-05-25 ENCOUNTER — Other Ambulatory Visit: Payer: Self-pay | Admitting: Family Medicine

## 2023-05-25 ENCOUNTER — Ambulatory Visit (INDEPENDENT_AMBULATORY_CARE_PROVIDER_SITE_OTHER): Payer: 59 | Admitting: Family Medicine

## 2023-05-25 ENCOUNTER — Encounter: Payer: Self-pay | Admitting: Family Medicine

## 2023-05-25 VITALS — BP 134/82 | HR 65 | Ht 73.0 in | Wt 243.0 lb

## 2023-05-25 DIAGNOSIS — Z0000 Encounter for general adult medical examination without abnormal findings: Secondary | ICD-10-CM | POA: Diagnosis not present

## 2023-05-25 DIAGNOSIS — E782 Mixed hyperlipidemia: Secondary | ICD-10-CM | POA: Diagnosis not present

## 2023-05-25 DIAGNOSIS — R7309 Other abnormal glucose: Secondary | ICD-10-CM

## 2023-05-25 DIAGNOSIS — Z125 Encounter for screening for malignant neoplasm of prostate: Secondary | ICD-10-CM

## 2023-05-25 NOTE — Progress Notes (Signed)
 Subjective:    Patient ID: Kenneth Young, male    DOB: November 06, 1970, 53 y.o.   MRN: [id number]  Kenneth Young is a 53 y.o. male presenting on 05/25/2023 for Annual Exam   HPI  Discussed the use of AI scribe software for clinical note transcription with the patient, who gave verbal consent to proceed.  History of Present Illness   [name] is a 53 year old male who presents for an annual wellness exam.   Elevated BP BP Controlled now. Prior elevation, now normalized. Current Meds - None Lifestyle: - Diet: goal to limit sodium in future - Exercise: improving Denies CP, dyspnea, HA, edema, dizziness / lightheadedness   HYPERLIPIDEMIA: - Last lipid panel 04/2021 LDL 141 Not taking any cholesterol med   Pre-Diabetes / Obesity BMI >32 Reports no concerns previously, elevated A1c 5.7 on last result. Meds: never on med Lifestyle: - Diet (not adhering any particular diet, goal to improve)  Denies hypoglycemia, polyuria, visual changes, numbness or tingling.  Dyshidrotic Eczema, Hands/ Palms  He has a history of dyshidrotic eczema, previously treated with prednisone and an ointment. The prednisone provided temporary relief, but symptoms returned after discontinuation. The ointment has been somewhat effective in managing the condition, although he has not used it heavily. He describes the condition as 'under control' but notes that his hands remain dry. No skin rashes aside from his known eczema.   PMH External Hemorrhoids   Taking Vitamin D immune benefit.     Health Maintenance:   Prostate CA Screening:  PSA 0.56 (2023) due for repeat. Currently asymptomatic except occasional nocturia. No known family history of prostate CA.   Colon CA Screening: Initial colonoscopy [facility] Dr Tobi Bastos 07/19/21 2 polyps repeat 3 years in 2026 Currently asymptomatic except external hemorrhoid see above. No known family history of colon CA. Due for screening test considering Cologuard, counseling  given       05/25/2023    8:36 AM 05/06/2023    1:29 PM 06/18/2022    9:14 AM  Depression screen PHQ 2/9  Decreased Interest 0 0 0  Down, Depressed, Hopeless 0 1 1  PHQ - 2 Score 0 1 1  Altered sleeping 0  0  Tired, decreased energy 0  0  Change in appetite 1  0  Feeling bad or failure about yourself  0  1  Trouble concentrating 0  1  Moving slowly or fidgety/restless 0  0  Suicidal thoughts 0  0  PHQ-9 Score 1  3  Difficult doing work/chores Not difficult at all Not difficult at all Somewhat difficult       05/25/2023    8:37 AM 05/06/2023    9:12 AM 06/18/2022    9:15 AM  GAD 7 : Generalized Anxiety Score  Nervous, Anxious, on Edge 0 1 1  Control/stop worrying 0 0 1  Worry too much - different things 0 0 1  Trouble relaxing 0 0 1  Restless 1  1  Easily annoyed or irritable 1 1 1   Afraid - awful might happen 0 0 1  Total GAD 7 Score 2  7  Anxiety Difficulty Somewhat difficult  Not difficult at all     Past Medical History:  Diagnosis Date   Genital herpes    Hemorrhoid    Past Surgical History:  Procedure Laterality Date   COLONOSCOPY WITH PROPOFOL N/A 05/19/2021   Procedure: COLONOSCOPY WITH PROPOFOL;  Surgeon: Wyline Mood, MD;  Location: [facility] ENDOSCOPY;  Service: Gastroenterology;  Laterality: N/A;   FRACTURE SURGERY     ORIF PROXIMAL TIBIAL PLATEAU FRACTURE     WISDOM TOOTH EXTRACTION     Social History   Socioeconomic History   Marital status: Married    Spouse name: Not on file   Number of children: 1   Years of education: College   Highest education level: Not on file  Occupational History   Occupation: Geneticist, molecular    Comment: Computers, Has degree in Classical Greek/Latin  Tobacco Use   Smoking status: Former    Current packs/day: 0.00    Average packs/day: 1 pack/day for 10.0 years (10.0 ttl pk-yrs)    Types: Cigarettes    Start date: 03/01/1982    Quit date: 03/01/1992    Years since quitting: 31.2   Smokeless tobacco: Former   Tobacco  comments:    Former history smoked for 10 years, then quit  Vaping Use   Vaping status: Former   Start date: 03/01/2005   Quit date: 12/31/2015  Substance and Sexual Activity   Alcohol use: Yes    Alcohol/week: 2.0 standard drinks of alcohol    Types: 2 Cans of beer per week    Comment: Limited to beer only   Drug use: Yes    Types: Marijuana    Comment: 3x weekly, recreational / relaxation   Sexual activity: Not on file  Other Topics Concern   Not on file  Social History Narrative   Not on file   Social Drivers of Health   Financial Resource Strain: Not on file  Food Insecurity: Not on file  Transportation Needs: Not on file  Physical Activity: Not on file  Stress: Not on file  Social Connections: Not on file  Intimate Partner Violence: Not on file   Family History  Problem Relation Age of Onset   Cataracts Mother    Osteoarthritis Mother        Bilateral Hip Replacement Surgery   Hypertension Brother    Prostate cancer Neg Hx    Colon cancer Neg Hx    Current Outpatient Medications on File Prior to Visit  Medication Sig   mometasone (ELOCON) 0.1 % ointment Apply topically daily.   No current facility-administered medications on file prior to visit.    Review of Systems Per HPI unless specifically indicated above     Objective:    BP 134/82 (BP Location: Left Arm, Patient Position: Sitting)   Pulse 65   Ht 6\' 1"  (1.854 m)   Wt 243 lb (110.2 kg)   SpO2 95%   BMI 32.06 kg/m   Wt Readings from Last 3 Encounters:  05/25/23 243 lb (110.2 kg)  05/06/23 239 lb (108.4 kg)  04/25/23 238 lb 6.4 oz (108.1 kg)    Physical Exam Vitals and nursing note reviewed.  Constitutional:      General: He is not in acute distress.    Appearance: He is well-developed. He is not diaphoretic.     Comments: Well-appearing, comfortable, cooperative  HENT:     Head: Normocephalic and atraumatic.  Eyes:     General:        Right eye: No discharge.        Left eye: No  discharge.     Conjunctiva/sclera: Conjunctivae normal.     Pupils: Pupils are equal, round, and reactive to light.  Neck:     Thyroid: No thyromegaly.  Cardiovascular:     Rate and Rhythm: Normal rate and regular rhythm.  Pulses: Normal pulses.     Heart sounds: Normal heart sounds. No murmur heard. Pulmonary:     Effort: Pulmonary effort is normal. No respiratory distress.     Breath sounds: Normal breath sounds. No wheezing or rales.  Abdominal:     General: Bowel sounds are normal. There is no distension.     Palpations: Abdomen is soft. There is no mass.     Tenderness: There is no abdominal tenderness.  Musculoskeletal:        General: No tenderness. Normal range of motion.     Cervical back: Normal range of motion and neck supple.     Right lower leg: No edema.     Left lower leg: No edema.     Comments: Upper / Lower Extremities: - Normal muscle tone, strength bilateral upper extremities 5/5, lower extremities 5/5  Lymphadenopathy:     Cervical: No cervical adenopathy.  Skin:    General: Skin is warm and dry.     Findings: Rash (dyshidrotic eczema) present. No erythema.  Neurological:     Mental Status: He is alert and oriented to person, place, and time.     Comments: Distal sensation intact to light touch all extremities  Psychiatric:        Mood and Affect: Mood normal.        Behavior: Behavior normal.        Thought Content: Thought content normal.     Comments: Well groomed, good eye contact, normal speech and thoughts     Results for orders placed or performed during the hospital encounter of 05/19/21  Surgical pathology   Collection Time: 05/19/21  9:37 AM  Result Value Ref Range   SURGICAL PATHOLOGY      SURGICAL PATHOLOGY CASE: [id number] PATIENT: Barron Alvine Surgical Pathology Report     Specimen Submitted: A. Colon polyp, descending; cbx B. Colon polyp, sigmoid; hot snare  Clinical History: Colon cancer screening Z12.11.  Colon  polyps, diverticulosis, inflamed diverticulum    DIAGNOSIS: A. COLON POLYP, DESCENDING; COLD BIOPSY: - TUBULAR ADENOMA. - NEGATIVE FOR HIGH-GRADE DYSPLASIA AND MALIGNANCY.  B. COLON POLYP, SIGMOID; HOT SNARE: - TUBULAR ADENOMA. - CAUTERIZED POLYP BASE APPEARS FREE OF DYSPLASIA. - NEGATIVE FOR HIGH-GRADE DYSPLASIA AND MALIGNANCY.  GROSS DESCRIPTION: A. Labeled: Descending colon polyp cbx Received: Formalin Collection time: 9:37 AM on 05/19/2021 Placed into formalin time: 9:37 AM on 05/19/2021 Tissue fragment(s): 1 Size: 0.4 x 0.2 x 0.1 cm Description: Tan soft tissue fragment Entirely submitted in 1 cassette.  B. Labeled: Sigmoid colon polyp hot snare Received: Formalin Collection time: 9:41 AM on 05/19/2021 Placed in to formalin time: 9:41 AM on 05/19/2021 Tissue fragment(s): 1 Size: 0.5 x 0.5 x 0.1 cm Description: Received is a pink soft tissue fragment.  The resection margin is inked green, and the fragment is bisected Entirely submitted in 1 cassette.  CM 05/19/2021  Final Diagnosis performed by Katherine Mantle, MD.   Electronically signed 05/20/2021 3:31:05PM The electronic signature indicates that the named Attending Pathologist has evaluated the specimen Technical component performed at [facility], [address], [place], [place] [address] Lab: [phone] Dir: Jolene Schimke, MD, MMM  Professional component performed at [facility], [facility], [address] [place], [place], [place] [address] Lab: [phone] Dir: Beryle Quant, MD       Assessment & Plan:   Problem List Items Addressed This Visit     Elevated hemoglobin A1c   Relevant Orders   Hemoglobin A1c   Mixed hyperlipidemia  Relevant Orders   Lipid panel   COMPLETE METABOLIC PANEL WITH GFR   CBC with Differential/Platelet   TSH   Other Visit Diagnoses       Annual physical exam    -  Primary   Relevant Orders   Lipid panel   Hemoglobin A1c   COMPLETE METABOLIC PANEL WITH GFR   CBC  with Differential/Platelet   PSA   TSH     Screening for prostate cancer       Relevant Orders   PSA        Updated Health Maintenance information Fasting labs ordered today Encouraged improvement to lifestyle with diet and exercise Goal of weight loss  Dyshidrotic Eczema Hands Recurrent dyshidrotic eczema on hands, improved with prednisone but recurred post-cessation. Ointment provides partial relief. Skin dryness persists, necessitating regular moisturization.  Still too frequent hand washing - Continue prescribed ointment mometasone - Advise regular use of moisturizer. - Avoid hot water and abrasive soaps.  Hyperlipidemia Elevated LDL previously Due for repeat Encourage lifestyle management Consider future Statin  The 10-year ASCVD risk score (Arnett DK, et al., 2019) is: 6.1%   Values used to calculate the score:     Age: 59 years     Sex: Male     Is Non-Hispanic African American: No     Diabetic: No     Tobacco smoker: No     Systolic Blood Pressure: 134 mmHg     Is BP treated: No     HDL Cholesterol: 41 mg/dL     Total Cholesterol: 201 mg/dL    General Health Maintenance - Future TDap - Next colonoscopy for 2026. - Provide information on optional heart screening CT scan coronary calcium score  Follow-up Routine follow-up for annual wellness check and monitoring of health issues. Lab results communicated via MyChart or phone call if MyChart not activated. - Schedule next annual wellness visit for March 2026. - Follow up on lab results via MyChart or phone call if MyChart not activated.        Orders Placed This Encounter  Procedures   Lipid panel    Has the patient fasted?:   Yes   Hemoglobin A1c   COMPLETE METABOLIC PANEL WITH GFR   CBC with Differential/Platelet   PSA   TSH    No orders of the defined types were placed in this encounter.    Follow up plan: Return for 1 year fasting lab > 1 week later Annual Physical.  Future labs  05/24/24  Saralyn Pilar, DO [facility] 05/25/2023, 8:09 AM

## 2023-05-25 NOTE — Patient Instructions (Addendum)
 Thank you for coming to the office today.  Labs today  Recommend future option screening Coronary Calcium Score Cardiac CT Scan. This is a screening test for patients aged 54-50+ with cardiovascular risk factors or who are healthy but would be interested in Cardiovascular Screening for heart disease. Even if there is a family history of heart disease, this imaging can be useful. Typically it can be done every 5+ years or at a different timeline we agree on  The scan will look at the chest and mainly focus on the heart and identify early signs of calcium build up or blockages within the heart arteries. It is not 100% accurate for identifying blockages or heart disease, but it is useful to help [place] predict who may have some early changes or be at risk in the future for a heart attack or cardiovascular problem.  The results are reviewed by a Cardiologist and they will document the results. It should become available on MyChart. Typically the results are divided into percentiles based on other patients of the same demographic and age. So it will compare your risk to others similar to you. If you have a higher score >99 or higher percentile >75%tile, it is recommended to consider Statin cholesterol therapy and or referral to Cardiologist. I will try to help explain your results and if we have questions we can contact the Cardiologist.  You will be contacted for scheduling. Usually it is done at any imaging facility through [facility], [facility] or [facility].  The cost is $99 flat fee total and it does not go through insurance, so no authorization is required.  DUE for FASTING BLOOD WORK (no food or drink after midnight before the lab appointment, only water or coffee without cream/sugar on the morning of)  SCHEDULE "Lab Only" visit in the morning at the clinic for lab draw in 1 YEAR  - Make sure Lab Only appointment is at about 1 week before your next appointment, so  that results will be available  For Lab Results, once available within 2-3 days of blood draw, you can can log in to MyChart online to view your results and a brief explanation. Also, we can discuss results at next follow-up visit.    Please schedule a Follow-up Appointment to: Return for 1 year fasting lab > 1 week later Annual Physical.  If you have any other questions or concerns, please feel free to call the office or send a message through MyChart. You may also schedule an earlier appointment if necessary.  Additionally, you may be receiving a survey about your experience at our office within a few days to 1 week by e-mail or mail. We value your feedback.  Saralyn Pilar, DO [facility] [place], [place]

## 2023-05-26 LAB — COMPLETE METABOLIC PANEL WITHOUT GFR
AG Ratio: 1.6 (calc) (ref 1.0–2.5)
ALT: 58 U/L — ABNORMAL HIGH (ref 9–46)
AST: 29 U/L (ref 10–35)
Albumin: 4.5 g/dL (ref 3.6–5.1)
Alkaline phosphatase (APISO): 65 U/L (ref 35–144)
BUN: 11 mg/dL (ref 7–25)
CO2: 27 mmol/L (ref 20–32)
Calcium: 9.3 mg/dL (ref 8.6–10.3)
Chloride: 104 mmol/L (ref 98–110)
Creat: 0.92 mg/dL (ref 0.70–1.30)
Globulin: 2.8 g/dL (ref 1.9–3.7)
Glucose, Bld: 105 mg/dL — ABNORMAL HIGH (ref 65–99)
Potassium: 4.2 mmol/L (ref 3.5–5.3)
Sodium: 138 mmol/L (ref 135–146)
Total Bilirubin: 0.4 mg/dL (ref 0.2–1.2)
Total Protein: 7.3 g/dL (ref 6.1–8.1)

## 2023-05-26 LAB — CBC WITH DIFFERENTIAL/PLATELET
Absolute Lymphocytes: 1467 {cells}/uL (ref 850–3900)
Absolute Monocytes: 715 {cells}/uL (ref 200–950)
Basophils Absolute: 80 {cells}/uL (ref 0–200)
Basophils Relative: 1.1 %
Eosinophils Absolute: 204 {cells}/uL (ref 15–500)
Eosinophils Relative: 2.8 %
HCT: 43.9 % (ref 38.5–50.0)
Hemoglobin: 14.8 g/dL (ref 13.2–17.1)
MCH: 29.6 pg (ref 27.0–33.0)
MCHC: 33.7 g/dL (ref 32.0–36.0)
MCV: 87.8 fL (ref 80.0–100.0)
MPV: 12 fL (ref 7.5–12.5)
Monocytes Relative: 9.8 %
Neutro Abs: 4833 {cells}/uL (ref 1500–7800)
Neutrophils Relative %: 66.2 %
Platelets: 159 10*3/uL (ref 140–400)
RBC: 5 10*6/uL (ref 4.20–5.80)
RDW: 13.1 % (ref 11.0–15.0)
Total Lymphocyte: 20.1 %
WBC: 7.3 10*3/uL (ref 3.8–10.8)

## 2023-05-26 LAB — LIPID PANEL
Cholesterol: 182 mg/dL (ref <200)
HDL: 38 mg/dL — ABNORMAL LOW (ref >=40)
LDL Cholesterol (Calc): 102 mg/dL — ABNORMAL HIGH
Non-HDL Cholesterol (Calc): 144 mg/dL — ABNORMAL HIGH (ref <130)
Total CHOL/HDL Ratio: 4.8 (calc) (ref <5.0)
Triglycerides: 314 mg/dL — ABNORMAL HIGH (ref <150)

## 2023-05-26 LAB — HEMOGLOBIN A1C
Hgb A1c MFr Bld: 6.2 %{Hb} — ABNORMAL HIGH (ref <5.7)
Mean Plasma Glucose: 131 mg/dL
eAG (mmol/L): 7.3 mmol/L

## 2023-05-26 LAB — PSA: PSA: 0.71 ng/mL (ref <=4.00)

## 2023-09-13 NOTE — Progress Notes (Signed)
 Patient walked into the office today concerned about a bill and collection notice he received.  Rachell the front office associate tried to help him but he got very loud and angry with her.  Other staff went to see if they could help and calm the situation.  Patient was shaking, loud and angry.  Asked the patient to calm down and call the number on the bill to have them reprocess the claim.  After telling the patient this multiple times he finally calmed down.

## 2024-05-24 ENCOUNTER — Other Ambulatory Visit

## 2024-05-30 ENCOUNTER — Encounter: Admitting: Family Medicine
